# Patient Record
Sex: Male | Born: 1975 | Race: White | Hispanic: No | Marital: Single | Smoking: Current every day smoker
Health system: Southern US, Community
[De-identification: ages and names within clinical notes are randomized; demographics above are authoritative.]

## PROBLEM LIST (undated history)

## (undated) DIAGNOSIS — I219 Acute myocardial infarction, unspecified: Secondary | ICD-10-CM

## (undated) DIAGNOSIS — I639 Cerebral infarction, unspecified: Secondary | ICD-10-CM

## (undated) DIAGNOSIS — R569 Unspecified convulsions: Secondary | ICD-10-CM

---

## 1999-04-01 ENCOUNTER — Emergency Department (HOSPITAL_COMMUNITY): Admission: EM | Admit: 1999-04-01 | Discharge: 1999-04-01 | Payer: Self-pay | Admitting: Emergency Medicine

## 1999-04-01 ENCOUNTER — Encounter: Payer: Self-pay | Admitting: Emergency Medicine

## 2001-03-28 ENCOUNTER — Emergency Department (HOSPITAL_COMMUNITY): Admission: AC | Admit: 2001-03-28 | Discharge: 2001-03-28 | Payer: Self-pay

## 2002-02-12 ENCOUNTER — Encounter: Payer: Self-pay | Admitting: Emergency Medicine

## 2002-02-12 ENCOUNTER — Emergency Department (HOSPITAL_COMMUNITY): Admission: EM | Admit: 2002-02-12 | Discharge: 2002-02-12 | Payer: Self-pay | Admitting: Emergency Medicine

## 2002-12-06 ENCOUNTER — Emergency Department (HOSPITAL_COMMUNITY): Admission: EM | Admit: 2002-12-06 | Discharge: 2002-12-06 | Payer: Self-pay | Admitting: Emergency Medicine

## 2006-07-30 ENCOUNTER — Emergency Department (HOSPITAL_COMMUNITY): Admission: EM | Admit: 2006-07-30 | Discharge: 2006-07-30 | Payer: Self-pay | Admitting: Emergency Medicine

## 2008-02-09 ENCOUNTER — Emergency Department (HOSPITAL_COMMUNITY): Admission: EM | Admit: 2008-02-09 | Discharge: 2008-02-09 | Payer: Self-pay | Admitting: Emergency Medicine

## 2008-04-20 ENCOUNTER — Emergency Department (HOSPITAL_COMMUNITY): Admission: EM | Admit: 2008-04-20 | Discharge: 2008-04-20 | Payer: Self-pay | Admitting: Emergency Medicine

## 2008-08-19 ENCOUNTER — Inpatient Hospital Stay: Payer: Self-pay | Admitting: Specialist

## 2008-09-03 ENCOUNTER — Emergency Department: Payer: Self-pay | Admitting: Emergency Medicine

## 2008-09-18 ENCOUNTER — Emergency Department: Payer: Self-pay | Admitting: Emergency Medicine

## 2008-09-24 ENCOUNTER — Emergency Department (HOSPITAL_COMMUNITY): Admission: EM | Admit: 2008-09-24 | Discharge: 2008-09-25 | Payer: Self-pay | Admitting: Emergency Medicine

## 2008-10-22 ENCOUNTER — Inpatient Hospital Stay: Payer: Self-pay | Admitting: Psychiatry

## 2010-01-02 ENCOUNTER — Emergency Department (HOSPITAL_COMMUNITY)
Admission: EM | Admit: 2010-01-02 | Discharge: 2010-01-02 | Payer: Self-pay | Source: Home / Self Care | Admitting: Emergency Medicine

## 2010-01-13 ENCOUNTER — Emergency Department (HOSPITAL_COMMUNITY): Admission: EM | Admit: 2010-01-13 | Payer: Self-pay | Source: Home / Self Care | Admitting: Emergency Medicine

## 2010-02-11 ENCOUNTER — Emergency Department (HOSPITAL_COMMUNITY)
Admission: EM | Admit: 2010-02-11 | Discharge: 2010-02-11 | Payer: Self-pay | Source: Home / Self Care | Admitting: Emergency Medicine

## 2010-02-14 LAB — POCT I-STAT, CHEM 8
BUN: 19 mg/dL (ref 6–23)
Calcium, Ion: 1.13 mmol/L (ref 1.12–1.32)
Chloride: 104 mEq/L (ref 96–112)
Creatinine, Ser: 1.3 mg/dL (ref 0.4–1.5)
HCT: 47 % (ref 39.0–52.0)
Hemoglobin: 16 g/dL (ref 13.0–17.0)

## 2010-02-14 LAB — RAPID URINE DRUG SCREEN, HOSP PERFORMED
Amphetamines: NOT DETECTED
Benzodiazepines: NOT DETECTED
Opiates: NOT DETECTED

## 2010-04-04 LAB — CBC
HCT: 43.5 % (ref 39.0–52.0)
MCH: 29.5 pg (ref 26.0–34.0)
MCV: 89.7 fL (ref 78.0–100.0)
Platelets: 234 10*3/uL (ref 150–400)

## 2010-04-04 LAB — POCT I-STAT, CHEM 8
Creatinine, Ser: 1.1 mg/dL (ref 0.4–1.5)
Glucose, Bld: 91 mg/dL (ref 70–99)
Hemoglobin: 15.6 g/dL (ref 13.0–17.0)
Potassium: 4 mEq/L (ref 3.5–5.1)
Sodium: 141 mEq/L (ref 135–145)

## 2010-04-04 LAB — URINALYSIS, ROUTINE W REFLEX MICROSCOPIC
Bilirubin Urine: NEGATIVE
Glucose, UA: NEGATIVE mg/dL
Hgb urine dipstick: NEGATIVE
Ketones, ur: NEGATIVE mg/dL
Urobilinogen, UA: 0.2 mg/dL (ref 0.0–1.0)
pH: 7.5 (ref 5.0–8.0)

## 2010-04-04 LAB — DIFFERENTIAL
Basophils Relative: 0 % (ref 0–1)
Monocytes Absolute: 0.9 10*3/uL (ref 0.1–1.0)
Neutrophils Relative %: 73 % (ref 43–77)

## 2010-04-04 LAB — GC/CHLAMYDIA PROBE AMP, URINE: Chlamydia, Swab/Urine, PCR: NEGATIVE

## 2010-04-28 LAB — GASTRIC OCCULT BLOOD (1-CARD TO LAB): Occult Blood, Gastric: POSITIVE — AB

## 2010-04-28 LAB — CBC
HCT: 44.6 % (ref 39.0–52.0)
Hemoglobin: 15 g/dL (ref 13.0–17.0)
MCV: 91 fL (ref 78.0–100.0)
Platelets: 261 10*3/uL (ref 150–400)

## 2010-04-28 LAB — DIFFERENTIAL
Basophils Absolute: 0 10*3/uL (ref 0.0–0.1)
Lymphs Abs: 1 10*3/uL (ref 0.7–4.0)
Monocytes Absolute: 0.7 10*3/uL (ref 0.1–1.0)
Neutrophils Relative %: 90 % — ABNORMAL HIGH (ref 43–77)

## 2010-04-28 LAB — RAPID URINE DRUG SCREEN, HOSP PERFORMED
Barbiturates: NOT DETECTED
Benzodiazepines: NOT DETECTED
Cocaine: POSITIVE — AB

## 2010-04-28 LAB — URINE MICROSCOPIC-ADD ON

## 2010-04-28 LAB — URINALYSIS, ROUTINE W REFLEX MICROSCOPIC
Glucose, UA: NEGATIVE mg/dL
Specific Gravity, Urine: 1.024 (ref 1.005–1.030)

## 2010-04-28 LAB — BASIC METABOLIC PANEL
Calcium: 9.3 mg/dL (ref 8.4–10.5)
Chloride: 106 mEq/L (ref 96–112)
Creatinine, Ser: 1.38 mg/dL (ref 0.4–1.5)
GFR calc Af Amer: >60 mL/min (ref >60)
Potassium: 3.6 mEq/L (ref 3.5–5.1)

## 2010-04-28 LAB — TYPE AND SCREEN: Antibody Screen: NEGATIVE

## 2010-05-08 LAB — POCT CARDIAC MARKERS
CKMB, poc: <1 ng/mL — ABNORMAL LOW (ref 1.0–8.0)
Troponin i, poc: <0.05 ng/mL (ref 0.00–0.09)
Troponin i, poc: <0.05 ng/mL (ref 0.00–0.09)

## 2010-05-08 LAB — CBC
MCHC: 33 g/dL (ref 30.0–36.0)
MCV: 89.4 fL (ref 78.0–100.0)
Platelets: 258 10*3/uL (ref 150–400)
RBC: 5.2 MIL/uL (ref 4.22–5.81)
RDW: 14.2 % (ref 11.5–15.5)

## 2010-05-08 LAB — DIFFERENTIAL
Basophils Absolute: 0.1 10*3/uL (ref 0.0–0.1)
Basophils Relative: 1 % (ref 0–1)
Eosinophils Absolute: 0.1 10*3/uL (ref 0.0–0.7)
Monocytes Relative: 6 % (ref 3–12)
Neutrophils Relative %: 74 % (ref 43–77)

## 2010-05-08 LAB — POCT I-STAT, CHEM 8
Glucose, Bld: 93 mg/dL (ref 70–99)
HCT: 48 % (ref 39.0–52.0)
Hemoglobin: 16.3 g/dL (ref 13.0–17.0)
Potassium: 3.9 mEq/L (ref 3.5–5.1)

## 2010-05-08 LAB — GLUCOSE, CAPILLARY

## 2010-07-28 IMAGING — CR DG CHEST 1V PORT
1 series · 1 of 1 positions shown · non-contrast
Comparison: Report dated 12/06/2002.

CLINICAL DATA: Smoker with chest pain, shortness of breath and left
arm weakness for the past week.

PORTABLE CHEST - 1 VIEW

[AP]
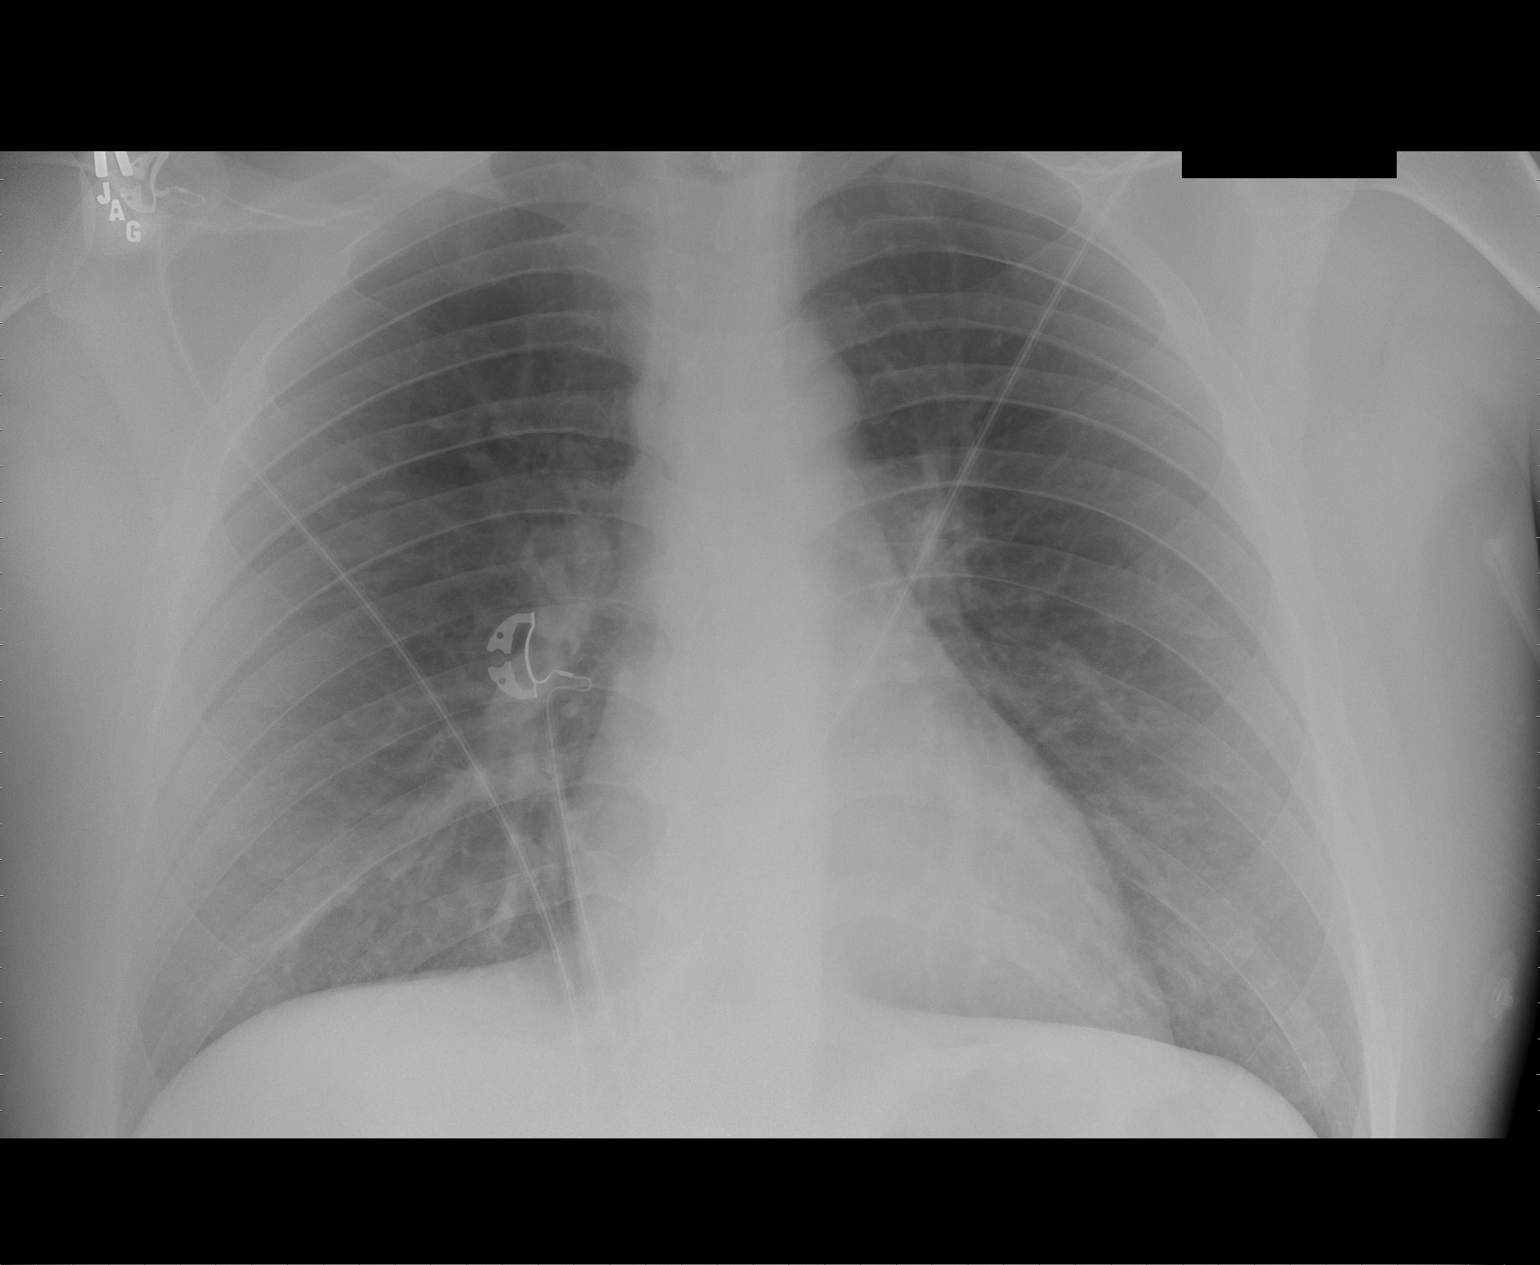

[1 of 1 positions shown; findings below may reference images not displayed]

FINDINGS: Normal sized heart.  Clear lungs with normal vascularity.
Unremarkable bones.
IMPRESSION: No acute abnormality.

## 2011-03-13 IMAGING — CR DG CHEST 2V
2 series · 2 of 2 positions shown · non-contrast
Comparison: 04/20/2008.

CLINICAL DATA: Seizure.  Vomiting blood.  Left side chest pain.

CHEST - 2 VIEW

[w chest lat]
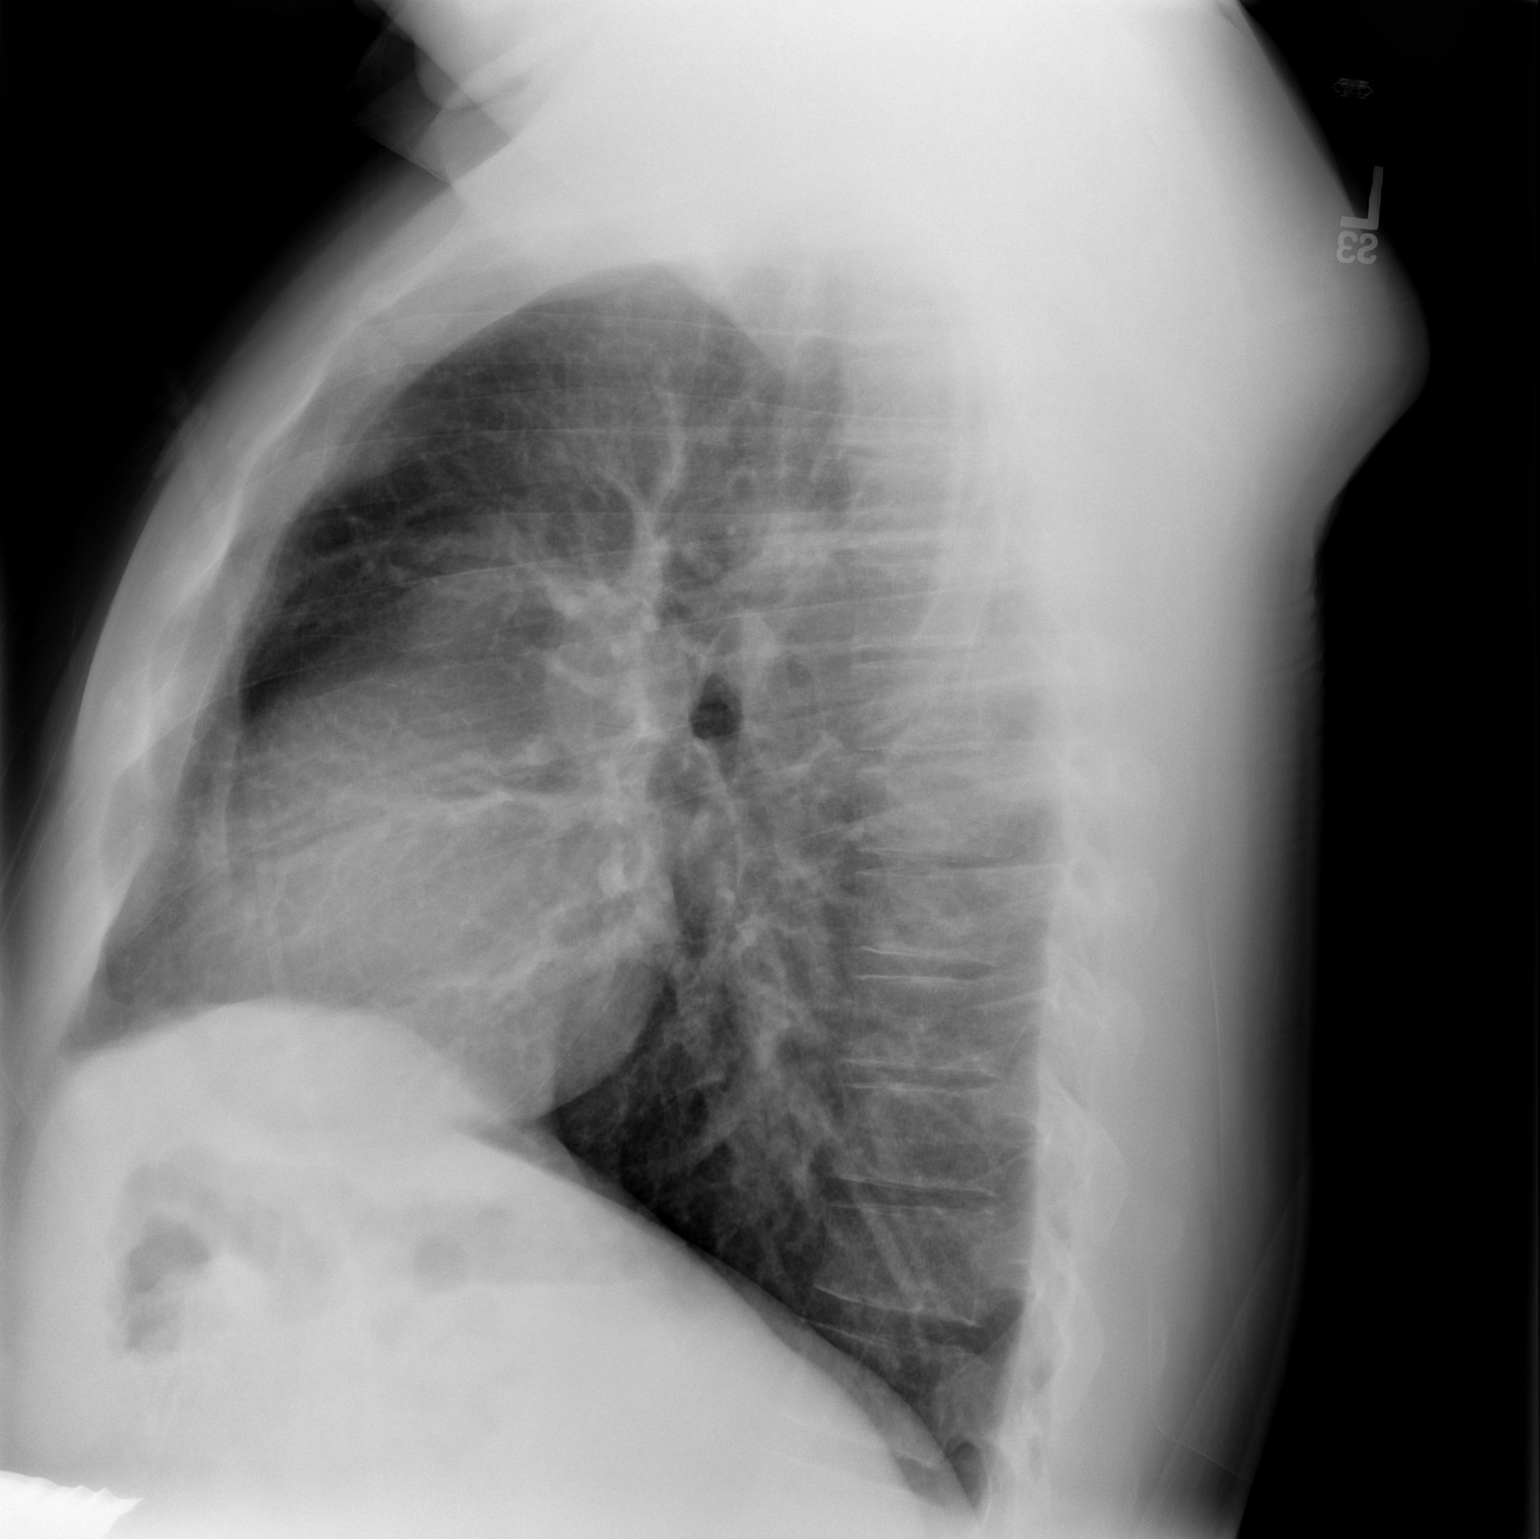

[w chest ap]
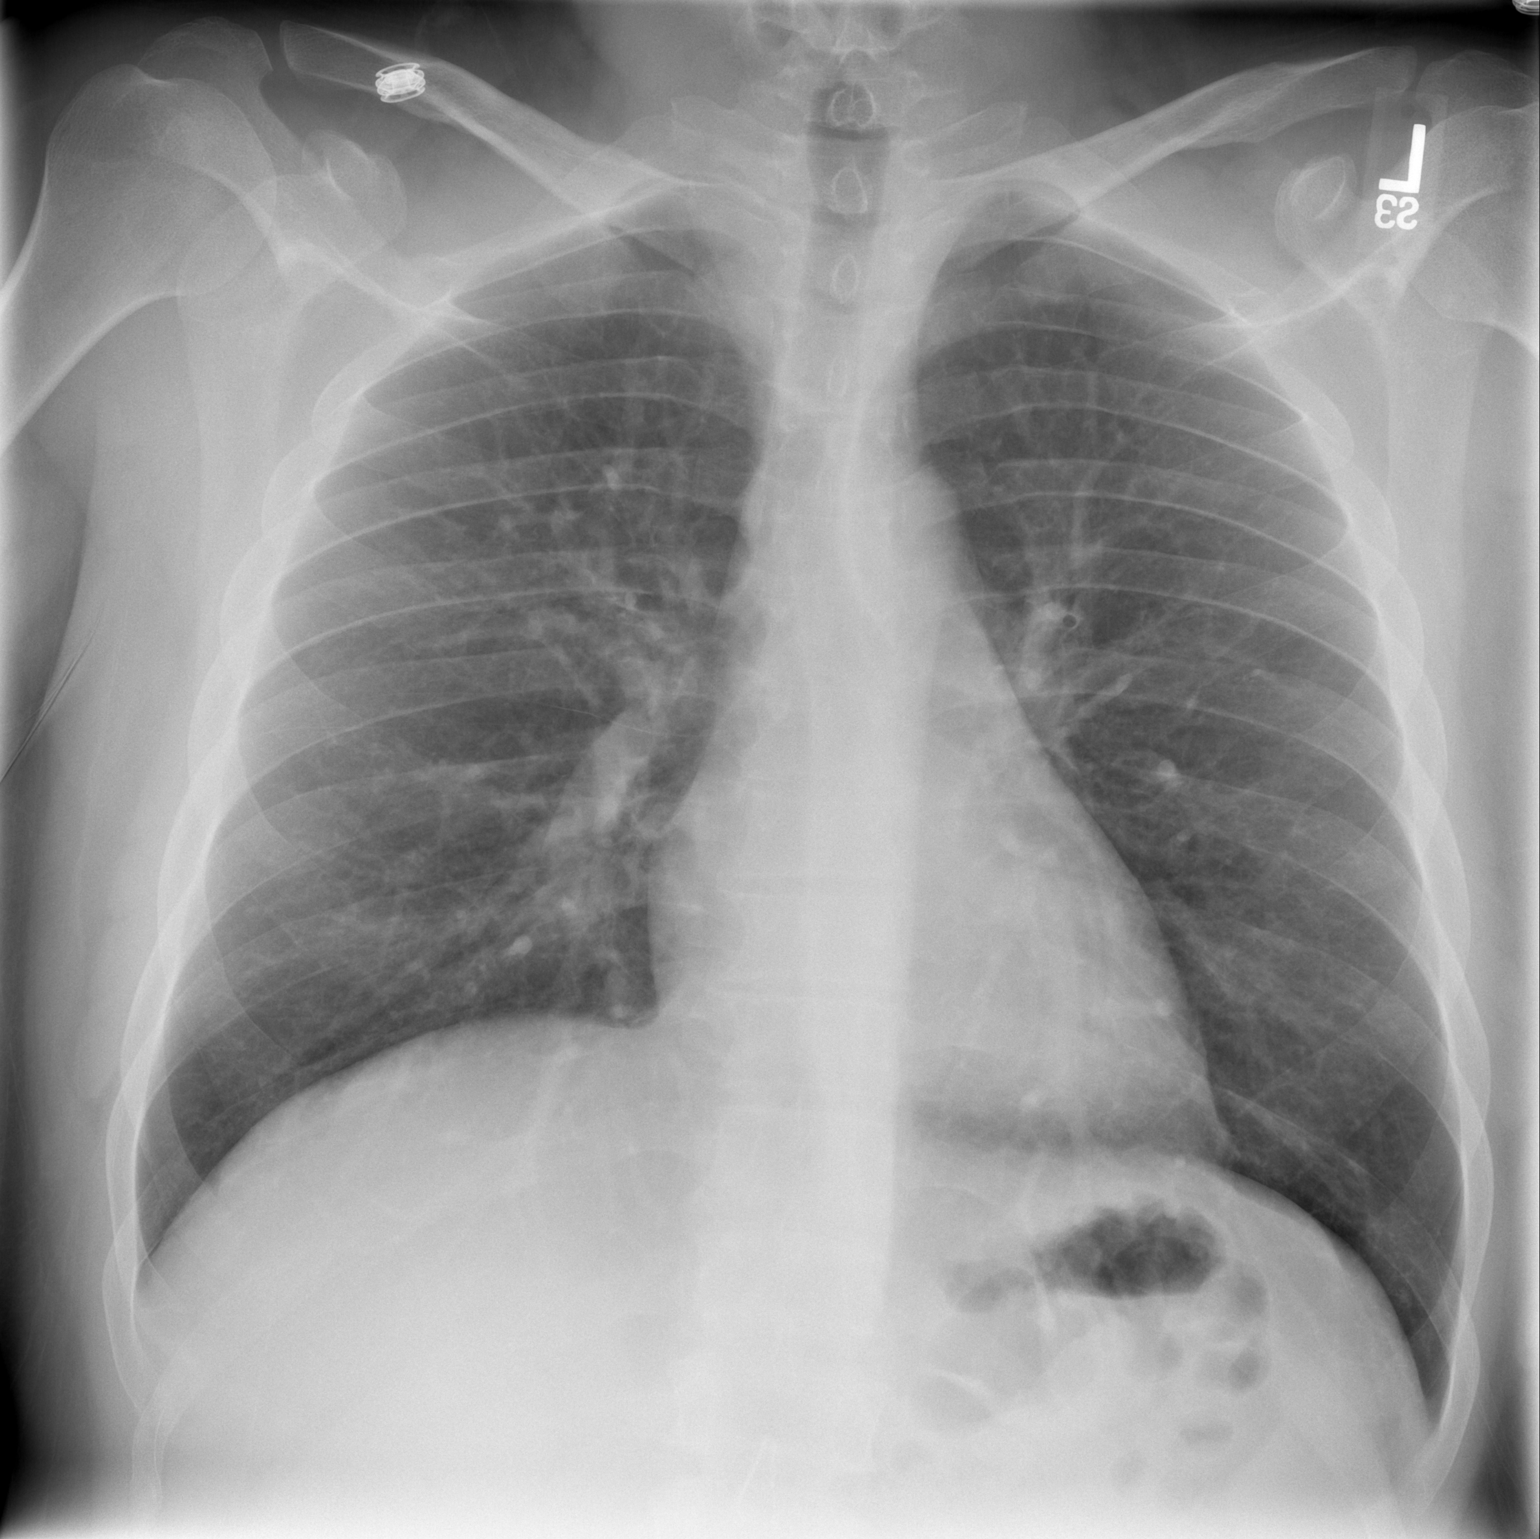

[2 of 2 positions shown; findings below may reference images not displayed]

FINDINGS: The lungs are clear without focal infiltrate, edema,
pneumothorax or pleural effusion. The cardiopericardial silhouette
is within normal limits for size. Imaged bony structures of the
thorax are intact.
IMPRESSION: Stable.  No acute cardiopulmonary findings.

## 2011-04-19 ENCOUNTER — Emergency Department: Payer: Self-pay | Admitting: Emergency Medicine

## 2011-04-19 LAB — COMPREHENSIVE METABOLIC PANEL
Alkaline Phosphatase: 74 U/L (ref 50–136)
Calcium, Total: 9.1 mg/dL (ref 8.5–10.1)
Creatinine: 0.93 mg/dL (ref 0.60–1.30)
EGFR (African American): >60
EGFR (Non-African Amer.): >60
Glucose: 74 mg/dL (ref 65–99)
Osmolality: 268 (ref 275–301)
SGPT (ALT): 28 U/L
Sodium: 134 mmol/L — ABNORMAL LOW (ref 136–145)
Total Protein: 8.1 g/dL (ref 6.4–8.2)

## 2011-04-19 LAB — CBC
HCT: 48.1 % (ref 40.0–52.0)
HGB: 16.2 g/dL (ref 13.0–18.0)
MCH: 30.1 pg (ref 26.0–34.0)
MCHC: 33.6 g/dL (ref 32.0–36.0)
MCV: 90 fL (ref 80–100)
Platelet: 256 10*3/uL (ref 150–440)
RBC: 5.37 10*6/uL (ref 4.40–5.90)
RDW: 14.2 % (ref 11.5–14.5)
WBC: 12.7 10*3/uL — ABNORMAL HIGH (ref 3.8–10.6)

## 2011-04-19 LAB — CK TOTAL AND CKMB (NOT AT ARMC)
CK, Total: 385 U/L — ABNORMAL HIGH (ref 35–232)
CK-MB: 3.9 ng/mL — ABNORMAL HIGH (ref 0.5–3.6)

## 2011-04-19 LAB — APTT: Activated PTT: 46.6 secs — ABNORMAL HIGH (ref 23.6–35.9)

## 2011-04-19 LAB — URINALYSIS, COMPLETE
Bilirubin,UR: NEGATIVE
Blood: NEGATIVE
Glucose,UR: NEGATIVE mg/dL (ref 0–75)
Hyaline Cast: 1
Leukocyte Esterase: NEGATIVE
Nitrite: NEGATIVE
Ph: 5 (ref 4.5–8.0)
Protein: NEGATIVE
RBC,UR: 1 /HPF (ref 0–5)
Specific Gravity: 1.013 (ref 1.003–1.030)
Squamous Epithelial: NONE SEEN
WBC UR: 3 /HPF (ref 0–5)

## 2011-04-19 LAB — TROPONIN I: Troponin-I: <0.02 ng/mL

## 2011-04-19 LAB — PROTIME-INR: Prothrombin Time: 13.5 secs (ref 11.5–14.7)

## 2013-03-04 ENCOUNTER — Emergency Department: Payer: Self-pay | Admitting: Internal Medicine

## 2013-03-04 LAB — COMPREHENSIVE METABOLIC PANEL
ALK PHOS: 69 U/L
ALT: 20 U/L (ref 12–78)
AST: 23 U/L (ref 15–37)
Albumin: 4.2 g/dL (ref 3.4–5.0)
Anion Gap: 3 — ABNORMAL LOW (ref 7–16)
BUN: 7 mg/dL (ref 7–18)
Bilirubin,Total: 0.4 mg/dL (ref 0.2–1.0)
CALCIUM: 9.5 mg/dL (ref 8.5–10.1)
CHLORIDE: 105 mmol/L (ref 98–107)
Co2: 27 mmol/L (ref 21–32)
Creatinine: 0.98 mg/dL (ref 0.60–1.30)
GLUCOSE: 85 mg/dL (ref 65–99)
Osmolality: 267 (ref 275–301)
POTASSIUM: 4 mmol/L (ref 3.5–5.1)
SODIUM: 135 mmol/L — AB (ref 136–145)
Total Protein: 7.9 g/dL (ref 6.4–8.2)

## 2013-03-04 LAB — URINALYSIS, COMPLETE
BILIRUBIN, UR: NEGATIVE
Bacteria: NONE SEEN
Blood: NEGATIVE
Glucose,UR: NEGATIVE mg/dL (ref 0–75)
Ketone: NEGATIVE
Leukocyte Esterase: NEGATIVE
Nitrite: NEGATIVE
PH: 7 (ref 4.5–8.0)
PROTEIN: NEGATIVE
RBC,UR: <1 /HPF (ref 0–5)
SPECIFIC GRAVITY: 1.002 (ref 1.003–1.030)
Squamous Epithelial: NONE SEEN

## 2013-03-04 LAB — CBC WITH DIFFERENTIAL/PLATELET
BASOS ABS: 0.1 10*3/uL (ref 0.0–0.1)
Basophil %: 0.7 %
EOS ABS: 0.2 10*3/uL (ref 0.0–0.7)
EOS PCT: 1.3 %
HCT: 45 % (ref 40.0–52.0)
HGB: 15.2 g/dL (ref 13.0–18.0)
LYMPHS ABS: 2.1 10*3/uL (ref 1.0–3.6)
Lymphocyte %: 14.5 %
MCH: 30.4 pg (ref 26.0–34.0)
MCHC: 33.7 g/dL (ref 32.0–36.0)
MCV: 90 fL (ref 80–100)
MONO ABS: 0.8 x10 3/mm (ref 0.2–1.0)
Monocyte %: 5.6 %
NEUTROS ABS: 11.5 10*3/uL — AB (ref 1.4–6.5)
NEUTROS PCT: 77.9 %
PLATELETS: 268 10*3/uL (ref 150–440)
RBC: 4.98 10*6/uL (ref 4.40–5.90)
RDW: 14.3 % (ref 11.5–14.5)
WBC: 14.7 10*3/uL — AB (ref 3.8–10.6)

## 2013-03-11 ENCOUNTER — Emergency Department: Payer: Self-pay | Admitting: Emergency Medicine

## 2013-03-11 LAB — COMPREHENSIVE METABOLIC PANEL
AST: 22 U/L (ref 15–37)
Albumin: 4.1 g/dL (ref 3.4–5.0)
Alkaline Phosphatase: 68 U/L
Anion Gap: 3 — ABNORMAL LOW (ref 7–16)
BILIRUBIN TOTAL: 0.3 mg/dL (ref 0.2–1.0)
BUN: 7 mg/dL (ref 7–18)
CALCIUM: 9.4 mg/dL (ref 8.5–10.1)
CHLORIDE: 107 mmol/L (ref 98–107)
CREATININE: 1.02 mg/dL (ref 0.60–1.30)
Co2: 27 mmol/L (ref 21–32)
EGFR (African American): >60
EGFR (Non-African Amer.): >60
GLUCOSE: 86 mg/dL (ref 65–99)
OSMOLALITY: 271 (ref 275–301)
Potassium: 3.8 mmol/L (ref 3.5–5.1)
SGPT (ALT): 18 U/L (ref 12–78)
Sodium: 137 mmol/L (ref 136–145)
Total Protein: 7.4 g/dL (ref 6.4–8.2)

## 2013-03-11 LAB — DRUG SCREEN, URINE
Amphetamines, Ur Screen: NEGATIVE (ref ?–1000)
BARBITURATES, UR SCREEN: NEGATIVE (ref ?–200)
Benzodiazepine, Ur Scrn: NEGATIVE (ref ?–200)
CANNABINOID 50 NG, UR ~~LOC~~: NEGATIVE (ref ?–50)
COCAINE METABOLITE, UR ~~LOC~~: NEGATIVE (ref ?–300)
MDMA (Ecstasy)Ur Screen: NEGATIVE (ref ?–500)
Methadone, Ur Screen: NEGATIVE (ref ?–300)
Opiate, Ur Screen: NEGATIVE (ref ?–300)
PHENCYCLIDINE (PCP) UR S: NEGATIVE (ref ?–25)
TRICYCLIC, UR SCREEN: NEGATIVE (ref ?–1000)

## 2013-03-11 LAB — URINALYSIS, COMPLETE
BLOOD: NEGATIVE
Bacteria: NONE SEEN
Bilirubin,UR: NEGATIVE
Glucose,UR: NEGATIVE mg/dL (ref 0–75)
KETONE: NEGATIVE
Leukocyte Esterase: NEGATIVE
NITRITE: NEGATIVE
Ph: 7 (ref 4.5–8.0)
Protein: NEGATIVE
RBC, UR: NONE SEEN /HPF (ref 0–5)
Specific Gravity: 1.003 (ref 1.003–1.030)
Squamous Epithelial: NONE SEEN
WBC UR: NONE SEEN /HPF (ref 0–5)

## 2013-03-11 LAB — CBC
HCT: 42.2 % (ref 40.0–52.0)
HGB: 14.6 g/dL (ref 13.0–18.0)
MCH: 30.9 pg (ref 26.0–34.0)
MCHC: 34.6 g/dL (ref 32.0–36.0)
MCV: 89 fL (ref 80–100)
PLATELETS: 249 10*3/uL (ref 150–440)
RBC: 4.72 10*6/uL (ref 4.40–5.90)
RDW: 14.1 % (ref 11.5–14.5)
WBC: 13.8 10*3/uL — AB (ref 3.8–10.6)

## 2013-03-11 LAB — TROPONIN I: Troponin-I: <0.02 ng/mL

## 2013-11-29 ENCOUNTER — Emergency Department: Payer: Self-pay | Admitting: Emergency Medicine

## 2013-11-29 LAB — CBC WITH DIFFERENTIAL/PLATELET
BASOS ABS: 0.1 10*3/uL (ref 0.0–0.1)
Basophil %: 0.9 %
EOS PCT: 1.8 %
Eosinophil #: 0.3 10*3/uL (ref 0.0–0.7)
HCT: 43.5 % (ref 40.0–52.0)
HGB: 14.8 g/dL (ref 13.0–18.0)
LYMPHS ABS: 2.8 10*3/uL (ref 1.0–3.6)
LYMPHS PCT: 19.9 %
MCH: 30.8 pg (ref 26.0–34.0)
MCHC: 33.9 g/dL (ref 32.0–36.0)
MCV: 91 fL (ref 80–100)
Monocyte #: 0.9 x10 3/mm (ref 0.2–1.0)
Monocyte %: 6.4 %
Neutrophil #: 10.1 10*3/uL — ABNORMAL HIGH (ref 1.4–6.5)
Neutrophil %: 71 %
PLATELETS: 277 10*3/uL (ref 150–440)
RBC: 4.79 10*6/uL (ref 4.40–5.90)
RDW: 14.3 % (ref 11.5–14.5)
WBC: 14.3 10*3/uL — AB (ref 3.8–10.6)

## 2013-11-29 LAB — COMPREHENSIVE METABOLIC PANEL
ALK PHOS: 69 U/L
AST: 14 U/L — AB (ref 15–37)
Albumin: 4.5 g/dL (ref 3.4–5.0)
Anion Gap: 5 — ABNORMAL LOW (ref 7–16)
BUN: 15 mg/dL (ref 7–18)
Bilirubin,Total: 0.3 mg/dL (ref 0.2–1.0)
CO2: 32 mmol/L (ref 21–32)
Calcium, Total: 9.4 mg/dL (ref 8.5–10.1)
Chloride: 102 mmol/L (ref 98–107)
Creatinine: 1.3 mg/dL (ref 0.60–1.30)
EGFR (Non-African Amer.): >60
GLUCOSE: 90 mg/dL (ref 65–99)
Osmolality: 278 (ref 275–301)
Potassium: 3.7 mmol/L (ref 3.5–5.1)
SGPT (ALT): 27 U/L
Sodium: 139 mmol/L (ref 136–145)
Total Protein: 8 g/dL (ref 6.4–8.2)

## 2013-11-29 LAB — URINALYSIS, COMPLETE
BILIRUBIN, UR: NEGATIVE
BLOOD: NEGATIVE
Bacteria: NONE SEEN
GLUCOSE, UR: NEGATIVE mg/dL (ref 0–75)
Ketone: NEGATIVE
Leukocyte Esterase: NEGATIVE
Nitrite: NEGATIVE
PROTEIN: NEGATIVE
Ph: 5 (ref 4.5–8.0)
RBC,UR: NONE SEEN /HPF (ref 0–5)
Specific Gravity: 1.025 (ref 1.003–1.030)
Squamous Epithelial: NONE SEEN

## 2014-04-19 ENCOUNTER — Emergency Department: Payer: Self-pay | Admitting: Emergency Medicine

## 2014-07-02 ENCOUNTER — Encounter: Payer: Self-pay | Admitting: Emergency Medicine

## 2014-07-02 ENCOUNTER — Emergency Department
Admission: EM | Admit: 2014-07-02 | Discharge: 2014-07-02 | Disposition: A | Discharge status: Home / Self Care | Payer: Self-pay | Attending: Emergency Medicine | Admitting: Emergency Medicine

## 2014-07-02 DIAGNOSIS — Y939 Activity, unspecified: Secondary | ICD-10-CM | POA: Insufficient documentation

## 2014-07-02 DIAGNOSIS — S29012A Strain of muscle and tendon of back wall of thorax, initial encounter: Secondary | ICD-10-CM | POA: Insufficient documentation

## 2014-07-02 DIAGNOSIS — X58XXXA Exposure to other specified factors, initial encounter: Secondary | ICD-10-CM | POA: Insufficient documentation

## 2014-07-02 DIAGNOSIS — Y929 Unspecified place or not applicable: Secondary | ICD-10-CM | POA: Insufficient documentation

## 2014-07-02 DIAGNOSIS — S39012A Strain of muscle, fascia and tendon of lower back, initial encounter: Secondary | ICD-10-CM

## 2014-07-02 DIAGNOSIS — Z720 Tobacco use: Secondary | ICD-10-CM | POA: Insufficient documentation

## 2014-07-02 DIAGNOSIS — Y999 Unspecified external cause status: Secondary | ICD-10-CM | POA: Insufficient documentation

## 2014-07-02 MED ORDER — IBUPROFEN 800 MG PO TABS: 800.0000 mg | ORAL_TABLET | Freq: Three times a day (TID) | ORAL | Status: DC | PRN

## 2014-07-02 MED ORDER — KETOROLAC TROMETHAMINE 30 MG/ML IJ SOLN: 60.0000 mg | Freq: Once | INTRAMUSCULAR | Status: AC

## 2014-07-02 MED ORDER — CYCLOBENZAPRINE HCL 10 MG PO TABS: 10.0000 mg | ORAL_TABLET | Freq: Three times a day (TID) | ORAL | Status: DC | PRN

## 2014-07-02 MED ORDER — HYDROCODONE-ACETAMINOPHEN 5-325 MG PO TABS: 1.0000 | ORAL_TABLET | ORAL | Status: DC | PRN

## 2014-07-02 MED ORDER — KETOROLAC TROMETHAMINE 60 MG/2ML IM SOLN
INTRAMUSCULAR | Status: AC
  Administered 2014-07-02: 60 mg
  Filled 2014-07-02: qty 2

## 2014-07-02 NOTE — ED Provider Notes (Signed)
[facility] Emergency Department Provider Note  ____________________________________________  Time seen: Approximately 12:49 PM  I have reviewed the triage vital signs and the nursing notes.   HISTORY  Chief Complaint Back Pain    HPI RONIEL HALLORAN is a 39 y.o. male who presents for evaluation of left lateral back strain. Patient states that he's been to a chiropractor with no relief denies any trauma. Denies any distress or difficulty breathing. He works outside a lot unsure if he lifted her poor muscle. Denies any urinary symptoms. Pain presently is 10 over 10 but will vary from 5-10.   History reviewed. No pertinent past medical history.  There are no active problems to display for this patient.   History reviewed. No pertinent past surgical history.  Current Outpatient Rx  Name  Route  Sig  Dispense  Refill  . cyclobenzaprine (FLEXERIL) 10 MG tablet   Oral   Take 1 tablet (10 mg total) by mouth every 8 (eight) hours as needed for muscle spasms.   30 tablet   1   . HYDROcodone-acetaminophen (NORCO) 5-325 MG per tablet   Oral   Take 1-2 tablets by mouth every 4 (four) hours as needed for moderate pain.   12 tablet   0   . ibuprofen (ADVIL,MOTRIN) 800 MG tablet   Oral   Take 1 tablet (800 mg total) by mouth every 8 (eight) hours as needed.   30 tablet   0     Allergies Review of patient's allergies indicates no known allergies.  History reviewed. No pertinent family history.  Social History History  Substance Use Topics  . Smoking status: Current Every Day Smoker  . Smokeless tobacco: Not on file  . Alcohol Use: Yes    Review of Systems Constitutional: No fever/chills Eyes: No visual changes. ENT: No sore throat. Cardiovascular: Denies chest pain. Respiratory: Denies shortness of breath. Gastrointestinal: No abdominal pain.  No nausea, no vomiting.  No diarrhea.  No constipation. Genitourinary: Negative for  dysuria. Musculoskeletal: Positive for left lateral thoracic strain. Skin: Negative for rash. Neurological: Negative for headaches, focal weakness or numbness.  10-point ROS otherwise negative.  ____________________________________________   PHYSICAL EXAM:  VITAL SIGNS: ED Triage Vitals  Enc Vitals Group     BP 07/02/14 1224 131/99 mmHg     Pulse Rate 07/02/14 1224 74     Resp --      Temp 07/02/14 1224 98.4 F (36.9 C)     Temp Source 07/02/14 1224 Oral     SpO2 07/02/14 1224 98 %     Weight 07/02/14 1224 200 lb (90.719 kg)     Height 07/02/14 1224 6' (1.829 m)     Head Cir --      Peak Flow --      Pain Score 07/02/14 1229 10     Pain Loc --      Pain Edu? --      Excl. in GC? --     Constitutional: Alert and oriented. Well appearing and in no acute distress. Eyes: Conjunctivae are normal. PERRL. EOMI. Head: Atraumatic. Nose: No congestion/rhinnorhea. Mouth/Throat: Mucous membranes are moist.  Oropharynx non-erythematous. Neck: No stridor.   Cardiovascular: Normal rate, regular rhythm. Grossly normal heart sounds.  Good peripheral circulation. Respiratory: Normal respiratory effort.  No retractions. Lungs CTAB. Gastrointestinal: Soft and nontender. No distention. No abdominal bruits. No CVA tenderness. Musculoskeletal: No lower extremity tenderness nor edema.  No joint effusions. No spinal tenderness. Shoulders full range  of motion bilaterally straight leg raise negative. Pain to the lateral dorsalis muscles only. left greater than right Neurologic:  Normal speech and language. No gross focal neurologic deficits are appreciated. Speech is normal. No gait instability. Skin:  Skin is warm, dry and intact. No rash noted. Psychiatric: Mood and affect are normal. Speech and behavior are normal.  ____________________________________________   LABS (all labs ordered are listed, but only abnormal results are displayed)  Labs Reviewed - No data to  display ____________________________________________  EKG  Deferred ____________________________________________  RADIOLOGY  Deferred ____________________________________________   PROCEDURES  Procedure(s) performed: None  Critical Care performed: No  ____________________________________________   INITIAL IMPRESSION / ASSESSMENT AND PLAN / ED COURSE  Pertinent labs & imaging results that were available during my care of the patient were reviewed by me and considered in my medical decision making (see chart for details).  Myofascial strain. Rx given for Motrin 800 and Flexeril 10 mg daily. Toradol 60 mg given IM while here in the clinic. Patient voices no other emergency medical conditions at this time. He will return to the ER if symptoms worsen. ____________________________________________   FINAL CLINICAL IMPRESSION(S) / ED DIAGNOSES  Final diagnoses:  Strain, back, initial encounter      Evangeline Dakin, PA-C 07/02/14 1337  Sharyn Creamer, MD 07/02/14 1620

## 2014-07-02 NOTE — ED Notes (Signed)
Pt to ed with c/o chronic back pain.  Pt states started about 3 years ago.  Pt denies recent injury but states pain is unbearable.

## 2014-07-02 NOTE — ED Notes (Signed)
Pt has chronic back pain that has gotten worse over time. Pt came to the ED today because of extreme pain in his lower and upper back. He states lower back pain is worse than the upper back pain. Denies  Any recent trauma . Color and breathing WNL , appears to be in no distress at this time.

## 2014-07-02 NOTE — Discharge Instructions (Signed)
Back Pain, Adult Low back pain is very common. About 1 in 5 people have back pain.The cause of low back pain is rarely dangerous. The pain often gets better over time.About half of people with a sudden onset of back pain feel better in just 2 weeks. About 8 in 10 people feel better by 6 weeks.  CAUSES Some common causes of back pain include:  Strain of the muscles or ligaments supporting the spine.  Wear and tear (degeneration) of the spinal discs.  Arthritis.  Direct injury to the back. DIAGNOSIS Most of the time, the direct cause of low back pain is not known.However, back pain can be treated effectively even when the exact cause of the pain is unknown.Answering your caregiver's questions about your overall health and symptoms is one of the most accurate ways to make sure the cause of your pain is not dangerous. If your caregiver needs more information, he or she may order lab work or imaging tests (X-rays or MRIs).However, even if imaging tests show changes in your back, this usually does not require surgery. HOME CARE INSTRUCTIONS For many people, back pain returns.Since low back pain is rarely dangerous, it is often a condition that people can learn to manageon their own.   Remain active. It is stressful on the back to sit or stand in one place. Do not sit, drive, or stand in one place for more than 30 minutes at a time. Take short walks on level surfaces as soon as pain allows.Try to increase the length of time you walk each day.  Do not stay in bed.Resting more than 1 or 2 days can delay your recovery.  Do not avoid exercise or work.Your body is made to move.It is not dangerous to be active, even though your back may hurt.Your back will likely heal faster if you return to being active before your pain is gone.  Pay attention to your body when you bend and lift. Many people have less discomfortwhen lifting if they bend their knees, keep the load close to their bodies,and  avoid twisting. Often, the most comfortable positions are those that put less stress on your recovering back.  Find a comfortable position to sleep. Use a firm mattress and lie on your side with your knees slightly bent. If you lie on your back, put a pillow under your knees.  Only take over-the-counter or prescription medicines as directed by your caregiver. Over-the-counter medicines to reduce pain and inflammation are often the most helpful.Your caregiver may prescribe muscle relaxant drugs.These medicines help dull your pain so you can more quickly return to your normal activities and healthy exercise.  Put ice on the injured area.  Put ice in a plastic bag.  Place a towel between your skin and the bag.  Leave the ice on for 15-20 minutes, 03-04 times a day for the first 2 to 3 days. After that, ice and heat may be alternated to reduce pain and spasms.  Ask your caregiver about trying back exercises and gentle massage. This may be of some benefit.  Avoid feeling anxious or stressed.Stress increases muscle tension and can worsen back pain.It is important to recognize when you are anxious or stressed and learn ways to manage it.Exercise is a great option. SEEK MEDICAL CARE IF:  You have pain that is not relieved with rest or medicine.  You have pain that does not improve in 1 week.  You have new symptoms.  You are generally not feeling well. SEEK   IMMEDIATE MEDICAL CARE IF:   You have pain that radiates from your back into your legs.  You develop new bowel or bladder control problems.  You have unusual weakness or numbness in your arms or legs.  You develop nausea or vomiting.  You develop abdominal pain.  You feel faint. Document Released: 01/08/2005 Document Revised: 07/10/2011 Document Reviewed: 05/12/2013 [employer] Patient Information 2015 [employer]. This information is not intended to replace advice given to you by your health care provider. Make sure you  discuss any questions you have with your health care provider.  

## 2014-07-05 ENCOUNTER — Emergency Department
Admission: EM | Admit: 2014-07-05 | Discharge: 2014-07-05 | Disposition: A | Discharge status: Home / Self Care | Payer: Self-pay | Attending: Emergency Medicine | Admitting: Emergency Medicine

## 2014-07-05 ENCOUNTER — Encounter: Payer: Self-pay | Admitting: Emergency Medicine

## 2014-07-05 DIAGNOSIS — Z720 Tobacco use: Secondary | ICD-10-CM | POA: Insufficient documentation

## 2014-07-05 DIAGNOSIS — G44219 Episodic tension-type headache, not intractable: Secondary | ICD-10-CM | POA: Insufficient documentation

## 2014-07-05 HISTORY — DX: Unspecified convulsions: R56.9

## 2014-07-05 MED ORDER — ONDANSETRON 4 MG PO TBDP
4.0000 mg | ORAL_TABLET | Freq: Once | ORAL | Status: AC
  Administered 2014-07-05: 4 mg via ORAL

## 2014-07-05 MED ORDER — BUTALBITAL-APAP-CAFFEINE 50-325-40 MG PO TABS
2.0000 | ORAL_TABLET | Freq: Once | ORAL | Status: AC
  Administered 2014-07-05: 2 via ORAL

## 2014-07-05 MED ORDER — BUTALBITAL-APAP-CAFFEINE 50-325-40 MG PO TABS
ORAL_TABLET | ORAL | Status: AC
  Administered 2014-07-05: 2 via ORAL
  Filled 2014-07-05: qty 2

## 2014-07-05 MED ORDER — ONDANSETRON 4 MG PO TBDP
ORAL_TABLET | ORAL | Status: AC
  Administered 2014-07-05: 4 mg via ORAL
  Filled 2014-07-05: qty 1

## 2014-07-05 NOTE — ED Provider Notes (Signed)
[facility] Emergency Department Provider Note  ____________________________________________  Time seen: Approximately 4:22 PM  I have reviewed the triage vital signs and the nursing notes.   HISTORY  Chief Complaint Nausea and Headache    HPI Jimmy Thompson is a 39 y.o. male presents to the emergency department for headache on the left forehead that radiates around to the left side of his head. He also complains of mild nausea without vomiting. States the headache has been there for 2 days. He does not recall the time of onset or what he was doing at that time. He was recently seen here for shoulder pain, but did not fill his medications. His pain is a 10 on 10. Light and sound have no effect on the pain.   Past Medical History  Diagnosis Date  . Seizures     There are no active problems to display for this patient.   No past surgical history on file.  Current Outpatient Rx  Name  Route  Sig  Dispense  Refill  . cyclobenzaprine (FLEXERIL) 10 MG tablet   Oral   Take 1 tablet (10 mg total) by mouth every 8 (eight) hours as needed for muscle spasms.   30 tablet   1   . HYDROcodone-acetaminophen (NORCO) 5-325 MG per tablet   Oral   Take 1-2 tablets by mouth every 4 (four) hours as needed for moderate pain.   12 tablet   0   . ibuprofen (ADVIL,MOTRIN) 800 MG tablet   Oral   Take 1 tablet (800 mg total) by mouth every 8 (eight) hours as needed.   30 tablet   0     Allergies Review of patient's allergies indicates no known allergies.  No family history on file.  Social History History  Substance Use Topics  . Smoking status: Current Every Day Smoker  . Smokeless tobacco: Not on file  . Alcohol Use: Yes    Review of Systems Constitutional: No fever/chills Eyes: No visual changes. ENT: No sore throat. Cardiovascular: Denies chest pain. Respiratory: Denies shortness of breath. Gastrointestinal: No abdominal pain.  No nausea, no  vomiting.  No diarrhea.  No constipation. Genitourinary: Negative for dysuria. Musculoskeletal: Negative for back pain. Left shoulder pain. Skin: Negative for rash. Neurological: Positive for headache negative for focal weakness or numbness.  10-point ROS otherwise negative.  ____________________________________________   PHYSICAL EXAM:  VITAL SIGNS: ED Triage Vitals  Enc Vitals Group     BP --      Pulse Rate 07/05/14 1543 55     Resp --      Temp 07/05/14 1543 98.5 F (36.9 C)     Temp Source 07/05/14 1543 Oral     SpO2 07/05/14 1543 99 %     Weight 07/05/14 1543 200 lb (90.719 kg)     Height 07/05/14 1543 6' (1.829 m)     Head Cir --      Peak Flow --      Pain Score 07/05/14 1543 5     Pain Loc --      Pain Edu? --      Excl. in GC? --     Constitutional: Alert and oriented. Well appearing and in no acute distress. Eyes: Conjunctivae are normal. PERRL. EOMI. Head: Atraumatic. Nose: No congestion/rhinnorhea. Mouth/Throat: Mucous membranes are moist.  Oropharynx non-erythematous. Neck: No stridor.   Cardiovascular: Normal rate, regular rhythm. Grossly normal heart sounds.  Good peripheral circulation. Respiratory: Normal respiratory effort.  No retractions. Lungs CTAB. Gastrointestinal: Soft and nontender. No distention. No abdominal bruits. No CVA tenderness. Musculoskeletal: No lower extremity tenderness nor edema.  No joint effusions. Full range of motion of left shoulder, tenderness diffusely over left shoulder without focal bony tenderness or step off. Neurologic:  Normal speech and language. No gross focal neurologic deficits are appreciated. Speech is normal. No gait instability. Negative Romberg, negative finger-nose test, pupils 2 and equal. Skin:  Skin is warm, dry and intact. No rash noted. Psychiatric: Mood and affect are normal. Speech and behavior are normal.  ____________________________________________   LABS (all labs ordered are listed, but only  abnormal results are displayed)  Labs Reviewed - No data to display ____________________________________________  EKG   ____________________________________________  RADIOLOGY  Not indicated ____________________________________________   PROCEDURES  Procedure(s) performed: None  Critical Care performed: No  ____________________________________________   INITIAL IMPRESSION / ASSESSMENT AND PLAN / ED COURSE  Pertinent labs & imaging results that were available during my care of the patient were reviewed by me and considered in my medical decision making (see chart for details).  Patient was advised to fill the prescriptions that he was given at his last visit. They will also help with headache. He was advised to establish primary care provider for follow-up. He was advised to return to the emergency department for symptoms that change or worsen if he is unable schedule an appointment. ____________________________________________   FINAL CLINICAL IMPRESSION(S) / ED DIAGNOSES  Final diagnoses:  None      Chinita Pester, FNP 07/05/14 1859  Maurilio Lovely, MD 07/05/14 2351

## 2014-07-05 NOTE — ED Notes (Signed)
Pt to ED with c/o headache and nausea since last night, states he was seen in the ED Friday for back pain and left shoulder pain, states he was given 3 prescriptions for pain. States he continues to have pain as well as new symptoms

## 2014-07-05 NOTE — Discharge Instructions (Signed)
Tension Headache You need to fill the prescriptions that you were given at your last visit. They will also help your headache. Return to the ER for symptoms that change or worsen if you are unable to schedule an appointment with primary care.  A tension headache is pain, pressure, or aching felt over the front and sides of the head. Tension headaches often come after stress, feeling worried (anxiety), or feeling sad or down for a while (depressed). HOME CARE  Only take medicine as told by your doctor.  Lie down in a dark, quiet room when you have a headache.  Keep a journal to find out if certain things bring on headaches. For example, write down:  What you eat and drink.  How much sleep you get.  Any change to your diet or medicines.  Relax by getting a massage or doing other relaxing activities.  Put ice or heat packs on the head and neck area as told by your doctor.  Lessen stress.  Sit up straight. Do not tighten (tense) your muscles.  Quit smoking if you smoke.  Lessen how much alcohol you drink.  Lessen how much caffeine you drink, or stop drinking caffeine.  Eat and exercise regularly.  Get enough sleep.  Avoid using too much pain medicine. GET HELP RIGHT AWAY IF:   Your headache becomes really bad.  You have a fever.  You have a stiff neck.  You have trouble seeing.  Your muscles are weak, or you lose muscle control.  You lose your balance or have trouble walking.  You feel like you will pass out (faint), or you pass out.  You have really bad symptoms that are different than your first symptoms.  You have problems with the medicines given to you by your doctor.  Your medicines do not work.  Your headache feels different than the other headaches.  You feel sick to your stomach (nauseous) or throw up (vomit). MAKE SURE YOU:   Understand these instructions.  Will watch your condition.  Will get help right away if you are not doing well or get  worse. Document Released: 04/04/2009 Document Revised: 04/02/2011 Document Reviewed: 12/29/2010 [facility] Patient Information 2015 [place], [place]. This information is not intended to replace advice given to you by your health care provider. Make sure you discuss any questions you have with your health care provider.

## 2015-01-19 ENCOUNTER — Emergency Department
Admission: EM | Admit: 2015-01-19 | Discharge: 2015-01-19 | Disposition: A | Discharge status: Home / Self Care | Payer: Self-pay | Attending: Emergency Medicine | Admitting: Emergency Medicine

## 2015-01-19 ENCOUNTER — Emergency Department: Payer: Self-pay

## 2015-01-19 DIAGNOSIS — F17200 Nicotine dependence, unspecified, uncomplicated: Secondary | ICD-10-CM | POA: Insufficient documentation

## 2015-01-19 DIAGNOSIS — R1032 Left lower quadrant pain: Secondary | ICD-10-CM | POA: Insufficient documentation

## 2015-01-19 DIAGNOSIS — G8929 Other chronic pain: Secondary | ICD-10-CM | POA: Insufficient documentation

## 2015-01-19 HISTORY — DX: Acute myocardial infarction, unspecified: I21.9

## 2015-01-19 LAB — CBC
HEMATOCRIT: 47.3 % (ref 40.0–52.0)
HEMOGLOBIN: 15.7 g/dL (ref 13.0–18.0)
MCH: 29.6 pg (ref 26.0–34.0)
MCHC: 33.2 g/dL (ref 32.0–36.0)
MCV: 89.2 fL (ref 80.0–100.0)
Platelets: 267 10*3/uL (ref 150–440)
RBC: 5.3 MIL/uL (ref 4.40–5.90)
RDW: 14.6 % — AB (ref 11.5–14.5)
WBC: 13.9 10*3/uL — ABNORMAL HIGH (ref 3.8–10.6)

## 2015-01-19 LAB — COMPREHENSIVE METABOLIC PANEL
ALBUMIN: 4.2 g/dL (ref 3.5–5.0)
ALT: 14 U/L — AB (ref 17–63)
AST: 18 U/L (ref 15–41)
Alkaline Phosphatase: 47 U/L (ref 38–126)
Anion gap: 5 (ref 5–15)
BUN: 9 mg/dL (ref 6–20)
CHLORIDE: 109 mmol/L (ref 101–111)
CO2: 25 mmol/L (ref 22–32)
CREATININE: 1.13 mg/dL (ref 0.61–1.24)
Calcium: 9.2 mg/dL (ref 8.9–10.3)
GFR calc non Af Amer: >60 mL/min (ref >60)
GLUCOSE: 106 mg/dL — AB (ref 65–99)
Potassium: 3.8 mmol/L (ref 3.5–5.1)
SODIUM: 139 mmol/L (ref 135–145)
Total Bilirubin: 0.4 mg/dL (ref 0.3–1.2)
Total Protein: 7.2 g/dL (ref 6.5–8.1)

## 2015-01-19 LAB — URINALYSIS COMPLETE WITH MICROSCOPIC (ARMC ONLY)
BACTERIA UA: NONE SEEN
Bilirubin Urine: NEGATIVE
GLUCOSE, UA: NEGATIVE mg/dL
Hgb urine dipstick: NEGATIVE
Ketones, ur: NEGATIVE mg/dL
Leukocytes, UA: NEGATIVE
Nitrite: NEGATIVE
PROTEIN: NEGATIVE mg/dL
SPECIFIC GRAVITY, URINE: 1.004 — AB (ref 1.005–1.030)
SQUAMOUS EPITHELIAL / LPF: NONE SEEN
pH: 6 (ref 5.0–8.0)

## 2015-01-19 LAB — LIPASE, BLOOD: LIPASE: 22 U/L (ref 11–51)

## 2015-01-19 MED ORDER — IBUPROFEN 600 MG PO TABS
ORAL_TABLET | ORAL | Status: AC
  Administered 2015-01-19: 600 mg via ORAL
  Filled 2015-01-19: qty 1

## 2015-01-19 MED ORDER — IBUPROFEN 400 MG PO TABS
600.0000 mg | ORAL_TABLET | Freq: Once | ORAL | Status: AC
  Administered 2015-01-19: 600 mg via ORAL

## 2015-01-19 NOTE — ED Notes (Signed)
Pt discharged home after verbalizing understanding of discharge instructions; nad noted. 

## 2015-01-19 NOTE — ED Notes (Signed)
Pt arrives to ED via POV c/o lower abdominal pain "off and on for a while", worsening over weekend and causing pt lose sleep. Denies NVD. Pt alert and oriented X4, active, cooperative, pt in NAD. RR even and unlabored, color WNL.

## 2015-01-19 NOTE — ED Provider Notes (Signed)
Time Seen: Approximately *1500 I have reviewed the triage notes  Chief Complaint: Abdominal Pain   History of Present Illness: Jimmy Thompson is a 39 y.o. male is a pleasant gentleman who presents with chronic intermittent lower middle quadrant abdominal pain. He states the pain is worse whenever he is doing some heavy lifting at work and also when he bends over to lace up his boots. He denies any testicular pain or masses. Denies any penile discharge or drainage. He denies any unusual weight loss or night sweats. Patient states the pain is been worse over the last several days.   Past Medical History  Diagnosis Date  . Seizures (HCC)   . MI (myocardial infarction) (HCC)     There are no active problems to display for this patient.   History reviewed. No pertinent past surgical history.  History reviewed. No pertinent past surgical history.  Current Outpatient Rx  Name  Route  Sig  Dispense  Refill  . cyclobenzaprine (FLEXERIL) 10 MG tablet   Oral   Take 1 tablet (10 mg total) by mouth every 8 (eight) hours as needed for muscle spasms.   30 tablet   1   . HYDROcodone-acetaminophen (NORCO) 5-325 MG per tablet   Oral   Take 1-2 tablets by mouth every 4 (four) hours as needed for moderate pain.   12 tablet   0   . ibuprofen (ADVIL,MOTRIN) 800 MG tablet   Oral   Take 1 tablet (800 mg total) by mouth every 8 (eight) hours as needed.   30 tablet   0     Allergies:  Review of patient's allergies indicates no known allergies.  Family History: No family history on file.  Social History: Social History  Substance Use Topics  . Smoking status: Current Every Day Smoker  . Smokeless tobacco: None  . Alcohol Use: Yes     Review of Systems:   10 point review of systems was performed and was otherwise negative:  Constitutional: No fever Eyes: No visual disturbances ENT: No sore throat, ear pain Cardiac: No chest pain Respiratory: No shortness of breath,  wheezing, or stridor Abdomen: No abdominal pain, no vomiting, No diarrhea Endocrine: No weight loss, No night sweats Extremities: No peripheral edema, cyanosis Skin: No rashes, easy bruising Neurologic: No focal weakness, trouble with speech or swollowing Urologic: No dysuria, Hematuria, or urinary frequency   Physical Exam:  ED Triage Vitals  Enc Vitals Group     BP 01/19/15 1154 116/70 mmHg     Pulse Rate 01/19/15 1154 78     Resp 01/19/15 1154 16     Temp 01/19/15 1154 98 F (36.7 C)     Temp Source 01/19/15 1154 Oral     SpO2 01/19/15 1154 100 %     Weight 01/19/15 1154 200 lb (90.719 kg)     Height 01/19/15 1154 6' (1.829 m)     Head Cir --      Peak Flow --      Pain Score 01/19/15 1155 10     Pain Loc --      Pain Edu? --      Excl. in GC? --     General: Awake , Alert , and Oriented times 3; GCS 15 Head: Normal cephalic , atraumatic Eyes: Pupils equal , round, reactive to light Nose/Throat: No nasal drainage, patent upper airway without erythema or exudate.  Neck: Supple, Full range of motion, No anterior adenopathy or palpable thyroid masses  Lungs: Clear to ascultation without wheezes , rhonchi, or rales Heart: Regular rate, regular rhythm without murmurs , gallops , or rubs Abdomen: Mild tenderness in the left lower quadrant without rebound, guarding , or rigidity; bowel sounds positive and symmetric in all 4 quadrants. No organomegaly .        Extremities: 2 plus symmetric pulses. No edema, clubbing or cyanosis Neurologic: normal ambulation, Motor symmetric without deficits, sensory intact Skin: warm, dry, no rashes Palpable genital masses with normal testes located in the scrotal sac with no penile discharge or drainage no palpable hernia  Labs:   All laboratory work was reviewed including any pertinent negatives or positives listed below:  Labs Reviewed  COMPREHENSIVE METABOLIC PANEL - Abnormal; Notable for the following:    Glucose, Bld 106 (*)    ALT 14  (*)    All other components within normal limits  CBC - Abnormal; Notable for the following:    WBC 13.9 (*)    RDW 14.6 (*)    All other components within normal limits  URINALYSIS COMPLETEWITH MICROSCOPIC ([facility] ONLY) - Abnormal; Notable for the following:    Color, Urine STRAW (*)    APPearance CLEAR (*)    Specific Gravity, Urine 1.004 (*)    All other components within normal limits  LIPASE, BLOOD      Radiology:  EXAM: CT ABDOMEN AND PELVIS WITHOUT CONTRAST  TECHNIQUE: Multidetector CT imaging of the abdomen and pelvis was performed following the standard protocol without IV contrast.  COMPARISON: 11/29/2013  FINDINGS: Lower chest: Normal.  Hepatobiliary: Normal.  Pancreas: Normal.  Spleen: Normal.  Adrenals/Urinary Tract: Normal.  Stomach/Bowel: Normal.  Vascular/Lymphatic: No significant abnormality.  Reproductive: Normal.  Other: No free air or free fluid.  Musculoskeletal: Normal.  IMPRESSION: Benign appearing abdomen.     I personally reviewed the radiologic studies     ED Course:  Patient's stay here was uneventful is given some ibuprofen for lower abdominal pain. Given his clinical presentation I felt this may be an abdominal hernia versus diverticulitis. No visible abnormalities per his CAT scan indicating a kidney stone. Patient was reassured that we did not find a reason to place him in the hospital for surgical consultation.    Assessment:  Acute exacerbation of chronic intermittent lower abdominal pain Possible hernia Final Clinical Impression:  Final diagnoses:  Abdominal pain, left lower quadrant     Plan: * Outpatient management patient was referred to surgery unassigned Patient was advised to return immediately if condition worsens. Patient was advised to follow up with their primary care physician or other specialized physicians involved in their outpatient care             Jennye Moccasin, MD 01/19/15  1714

## 2015-01-19 NOTE — Discharge Instructions (Signed)

## 2015-01-28 ENCOUNTER — Emergency Department: Payer: Self-pay

## 2015-01-28 ENCOUNTER — Encounter: Payer: Self-pay | Admitting: Emergency Medicine

## 2015-01-28 ENCOUNTER — Emergency Department
Admission: EM | Admit: 2015-01-28 | Discharge: 2015-01-28 | Disposition: A | Discharge status: Home / Self Care | Payer: Self-pay | Attending: Emergency Medicine | Admitting: Emergency Medicine

## 2015-01-28 DIAGNOSIS — K529 Noninfective gastroenteritis and colitis, unspecified: Secondary | ICD-10-CM

## 2015-01-28 DIAGNOSIS — F17200 Nicotine dependence, unspecified, uncomplicated: Secondary | ICD-10-CM | POA: Insufficient documentation

## 2015-01-28 LAB — COMPREHENSIVE METABOLIC PANEL
ALBUMIN: 3.7 g/dL (ref 3.5–5.0)
ALK PHOS: 44 U/L (ref 38–126)
ALT: 12 U/L — AB (ref 17–63)
AST: 14 U/L — AB (ref 15–41)
Anion gap: 5 (ref 5–15)
BILIRUBIN TOTAL: 0.4 mg/dL (ref 0.3–1.2)
BUN: 18 mg/dL (ref 6–20)
CALCIUM: 8.7 mg/dL — AB (ref 8.9–10.3)
CO2: 27 mmol/L (ref 22–32)
CREATININE: 1.05 mg/dL (ref 0.61–1.24)
Chloride: 108 mmol/L (ref 101–111)
GFR calc Af Amer: >60 mL/min (ref >60)
GLUCOSE: 98 mg/dL (ref 65–99)
Potassium: 4.2 mmol/L (ref 3.5–5.1)
Sodium: 140 mmol/L (ref 135–145)
TOTAL PROTEIN: 5.9 g/dL — AB (ref 6.5–8.1)

## 2015-01-28 LAB — CBC
HEMATOCRIT: 45.1 % (ref 40.0–52.0)
Hemoglobin: 14.9 g/dL (ref 13.0–18.0)
MCH: 29 pg (ref 26.0–34.0)
MCHC: 33 g/dL (ref 32.0–36.0)
MCV: 88 fL (ref 80.0–100.0)
PLATELETS: 236 10*3/uL (ref 150–440)
RBC: 5.13 MIL/uL (ref 4.40–5.90)
RDW: 14.8 % — AB (ref 11.5–14.5)
WBC: 13.4 10*3/uL — ABNORMAL HIGH (ref 3.8–10.6)

## 2015-01-28 LAB — URINALYSIS COMPLETE WITH MICROSCOPIC (ARMC ONLY)
BACTERIA UA: NONE SEEN
Bilirubin Urine: NEGATIVE
GLUCOSE, UA: NEGATIVE mg/dL
Hgb urine dipstick: NEGATIVE
KETONES UR: NEGATIVE mg/dL
Leukocytes, UA: NEGATIVE
NITRITE: NEGATIVE
PROTEIN: NEGATIVE mg/dL
Specific Gravity, Urine: 1.011 (ref 1.005–1.030)
Squamous Epithelial / LPF: NONE SEEN
WBC, UA: NONE SEEN WBC/hpf (ref 0–5)
pH: 9 — ABNORMAL HIGH (ref 5.0–8.0)

## 2015-01-28 LAB — LIPASE, BLOOD: Lipase: 26 U/L (ref 11–51)

## 2015-01-28 MED ORDER — METRONIDAZOLE 500 MG PO TABS: 500.0000 mg | ORAL_TABLET | Freq: Three times a day (TID) | ORAL | Status: AC

## 2015-01-28 MED ORDER — IOHEXOL 240 MG/ML SOLN: 25.0000 mL | Freq: Once | INTRAMUSCULAR | Status: DC | PRN

## 2015-01-28 MED ORDER — IOHEXOL 300 MG/ML  SOLN
100.0000 mL | Freq: Once | INTRAMUSCULAR | Status: AC | PRN
  Administered 2015-01-28: 100 mL via INTRAVENOUS

## 2015-01-28 MED ORDER — CIPROFLOXACIN HCL 500 MG PO TABS
500.0000 mg | ORAL_TABLET | Freq: Once | ORAL | Status: AC
  Administered 2015-01-28: 500 mg via ORAL
  Filled 2015-01-28: qty 1

## 2015-01-28 MED ORDER — OXYCODONE-ACETAMINOPHEN 5-325 MG PO TABS: 1.0000 | ORAL_TABLET | ORAL | Status: DC | PRN

## 2015-01-28 MED ORDER — MORPHINE SULFATE (PF) 4 MG/ML IV SOLN
4.0000 mg | Freq: Once | INTRAVENOUS | Status: AC
  Administered 2015-01-28: 4 mg via INTRAVENOUS
  Filled 2015-01-28: qty 1

## 2015-01-28 MED ORDER — CIPROFLOXACIN HCL 500 MG PO TABS: 500.0000 mg | ORAL_TABLET | Freq: Two times a day (BID) | ORAL | Status: AC

## 2015-01-28 MED ORDER — METRONIDAZOLE 500 MG PO TABS
500.0000 mg | ORAL_TABLET | Freq: Once | ORAL | Status: AC
  Administered 2015-01-28: 500 mg via ORAL
  Filled 2015-01-28: qty 1

## 2015-01-28 NOTE — ED Notes (Signed)
MD at bedside. 

## 2015-01-28 NOTE — ED Provider Notes (Signed)
[facility] Emergency Department Provider Note  ____________________________________________  Time seen: Approximately 12:28 PM  I have reviewed the triage vital signs and the nursing notes.   HISTORY  Chief Complaint Abdominal Pain    HPI Jimmy Thompson is a 40 y.o. male patient reports one week of left lower quadrant abdominal pain. He reports it comes on after he eats about 15 or 20 minutes later severe and crampy in nature. Sometimes he can stretch out in bed make it better other times it hits him and he doubles  up in a ball. No history of nausea or vomiting. Patient had a CT without contrast recently for abdominal pain which was unrevealing. Patient denies any fever. He reports that the pain lasts for a while and then goes away.   Past Medical History  Diagnosis Date  . Seizures (HCC)   . MI (myocardial infarction) (HCC)     There are no active problems to display for this patient.   History reviewed. No pertinent past surgical history.  Current Outpatient Rx  Name  Route  Sig  Dispense  Refill  . ciprofloxacin (CIPRO) 500 MG tablet   Oral   Take 1 tablet (500 mg total) by mouth 2 (two) times daily.   20 tablet   0   . cyclobenzaprine (FLEXERIL) 10 MG tablet   Oral   Take 1 tablet (10 mg total) by mouth every 8 (eight) hours as needed for muscle spasms. Patient not taking: Reported on 01/28/2015   30 tablet   1   . HYDROcodone-acetaminophen (NORCO) 5-325 MG per tablet   Oral   Take 1-2 tablets by mouth every 4 (four) hours as needed for moderate pain. Patient not taking: Reported on 01/28/2015   12 tablet   0   . ibuprofen (ADVIL,MOTRIN) 800 MG tablet   Oral   Take 1 tablet (800 mg total) by mouth every 8 (eight) hours as needed. Patient not taking: Reported on 01/28/2015   30 tablet   0   . metroNIDAZOLE (FLAGYL) 500 MG tablet   Oral   Take 1 tablet (500 mg total) by mouth 3 (three) times daily.   42 tablet   0   .  oxyCODONE-acetaminophen (ROXICET) 5-325 MG tablet   Oral   Take 1 tablet by mouth every 4 (four) hours as needed for severe pain.   10 tablet   0     Allergies Review of patient's allergies indicates no known allergies.  No family history on file.  Social History Social History  Substance Use Topics  . Smoking status: Current Every Day Smoker  . Smokeless tobacco: None  . Alcohol Use: Yes    Review of Systems Constitutional: No fever/chills Eyes: No visual changes. ENT: No sore throat. Cardiovascular: Denies chest pain. Respiratory: Denies shortness of breath. Gastrointestinal: See history of present illness Genitourinary: Negative for dysuria. Musculoskeletal: Negative for back pain. Skin: Negative for rash. Neurological: Negative for headaches, focal weakness or numbness.  10-point ROS otherwise negative.  ____________________________________________   PHYSICAL EXAM:  VITAL SIGNS: ED Triage Vitals  Enc Vitals Group     BP 01/28/15 1013 116/70 mmHg     Pulse Rate 01/28/15 1013 73     Resp 01/28/15 1013 18     Temp 01/28/15 1013 98.2 F (36.8 C)     Temp Source 01/28/15 1013 Oral     SpO2 01/28/15 1013 99 %     Weight 01/28/15 1013 212 lb (96.163 kg)  Height 01/28/15 1013 6' (1.829 m)     Head Cir --      Peak Flow --      Pain Score 01/28/15 1013 10     Pain Loc --      Pain Edu? --      Excl. in GC? --     Constitutional: Alert and oriented. Well appearing and in no acute distress. Eyes: Conjunctivae are normal. PERRL. EOMI. Head: Atraumatic. Nose: No congestion/rhinnorhea. Mouth/Throat: Mucous membranes are moist.  Oropharynx non-erythematous. Neck: No stridor.  Cardiovascular: Normal rate, regular rhythm. Grossly normal heart sounds.  Good peripheral circulation. Respiratory: Normal respiratory effort.  No retractions. Lungs CTAB. Gastrointestinal: Soft tender to palpation and percussion in the left lower quadrant.. No distention. No  abdominal bruits. No CVA tenderness. Musculoskeletal: No lower extremity tenderness nor edema.  No joint effusions. Neurologic:  Normal speech and language. No gross focal neurologic deficits are appreciated. No gait instability. Skin:  Skin is warm, dry and intact. No rash noted. Psychiatric: Mood and affect are normal. Speech and behavior are normal.  ____________________________________________   LABS (all labs ordered are listed, but only abnormal results are displayed)  Labs Reviewed  COMPREHENSIVE METABOLIC PANEL - Abnormal; Notable for the following:    Calcium 8.7 (*)    Total Protein 5.9 (*)    AST 14 (*)    ALT 12 (*)    All other components within normal limits  CBC - Abnormal; Notable for the following:    WBC 13.4 (*)    RDW 14.8 (*)    All other components within normal limits  URINALYSIS COMPLETEWITH MICROSCOPIC ([facility] ONLY) - Abnormal; Notable for the following:    Color, Urine STRAW (*)    APPearance TURBID (*)    pH 9.0 (*)    All other components within normal limits  LIPASE, BLOOD   ____________________________________________  EKG  EKG read and interpreted by me shows normal sinus rhythm a rate of 75 normal axis there is some PR segment depression most notable in lead 23F and V4 5 and 6. Patient does not have any chest pain or shortness of breath. ____________________________________________  RADIOLOGY  CT per radiology shows proximal enteritis.  ____________________________________________   PROCEDURES  ____________________________________________   INITIAL IMPRESSION / ASSESSMENT AND PLAN / ED COURSE  Pertinent labs & imaging results that were available during my care of the patient were reviewed by me and considered in my medical decision making (see chart for details).  Discussed with Dr. wall on call for gastroenterology. He feels this could be viral or possibly bacterial we will treat with Cipro and Flagyl patient should get better  shortly will t have him return if he is worse ____________________________________________   FINAL CLINICAL IMPRESSION(S) / ED DIAGNOSES  Final diagnoses:  Enteritis      Arnaldo Natal, MD 01/28/15 2131

## 2015-01-28 NOTE — ED Notes (Signed)
Patient transported to CT 

## 2015-01-28 NOTE — ED Notes (Signed)
EMS pt , on and off vomiting , left severe abd pain, no urinary symptoms, increasing episodes of dizziness and difficulty breathing. No distress noted on arrival

## 2015-07-29 ENCOUNTER — Emergency Department
Admission: EM | Admit: 2015-07-29 | Discharge: 2015-07-29 | Disposition: A | Discharge status: Home / Self Care | Payer: Self-pay | Attending: Student | Admitting: Student

## 2015-07-29 ENCOUNTER — Emergency Department: Payer: Self-pay

## 2015-07-29 DIAGNOSIS — F1290 Cannabis use, unspecified, uncomplicated: Secondary | ICD-10-CM | POA: Insufficient documentation

## 2015-07-29 DIAGNOSIS — R0789 Other chest pain: Secondary | ICD-10-CM

## 2015-07-29 DIAGNOSIS — Z8669 Personal history of other diseases of the nervous system and sense organs: Secondary | ICD-10-CM | POA: Insufficient documentation

## 2015-07-29 DIAGNOSIS — J208 Acute bronchitis due to other specified organisms: Secondary | ICD-10-CM | POA: Insufficient documentation

## 2015-07-29 DIAGNOSIS — J069 Acute upper respiratory infection, unspecified: Secondary | ICD-10-CM

## 2015-07-29 DIAGNOSIS — F17210 Nicotine dependence, cigarettes, uncomplicated: Secondary | ICD-10-CM | POA: Insufficient documentation

## 2015-07-29 LAB — COMPREHENSIVE METABOLIC PANEL
ALK PHOS: 61 U/L (ref 38–126)
ALT: 28 U/L (ref 17–63)
ANION GAP: 6 (ref 5–15)
AST: 27 U/L (ref 15–41)
Albumin: 4 g/dL (ref 3.5–5.0)
BILIRUBIN TOTAL: 0.3 mg/dL (ref 0.3–1.2)
BUN: 13 mg/dL (ref 6–20)
CALCIUM: 8.8 mg/dL — AB (ref 8.9–10.3)
CO2: 25 mmol/L (ref 22–32)
Chloride: 104 mmol/L (ref 101–111)
Creatinine, Ser: 0.94 mg/dL (ref 0.61–1.24)
GFR calc non Af Amer: >60 mL/min (ref >60)
Glucose, Bld: 92 mg/dL (ref 65–99)
Potassium: 4.5 mmol/L (ref 3.5–5.1)
Sodium: 135 mmol/L (ref 135–145)
TOTAL PROTEIN: 7.5 g/dL (ref 6.5–8.1)

## 2015-07-29 LAB — CBC WITH DIFFERENTIAL/PLATELET
BASOS ABS: 0.1 10*3/uL (ref 0–0.1)
Basophils Relative: 1 %
Eosinophils Absolute: 0.4 10*3/uL (ref 0–0.7)
Eosinophils Relative: 3 %
HEMATOCRIT: 43.2 % (ref 40.0–52.0)
HEMOGLOBIN: 15 g/dL (ref 13.0–18.0)
LYMPHS PCT: 12 %
Lymphs Abs: 1.3 10*3/uL (ref 1.0–3.6)
MCH: 30.5 pg (ref 26.0–34.0)
MCHC: 34.7 g/dL (ref 32.0–36.0)
MCV: 87.9 fL (ref 80.0–100.0)
MONO ABS: 0.8 10*3/uL (ref 0.2–1.0)
MONOS PCT: 7 %
NEUTROS ABS: 8.8 10*3/uL — AB (ref 1.4–6.5)
NEUTROS PCT: 77 %
Platelets: 254 10*3/uL (ref 150–440)
RBC: 4.92 MIL/uL (ref 4.40–5.90)
RDW: 15.3 % — ABNORMAL HIGH (ref 11.5–14.5)
WBC: 11.4 10*3/uL — ABNORMAL HIGH (ref 3.8–10.6)

## 2015-07-29 LAB — TROPONIN I: Troponin I: <0.03 ng/mL (ref <0.03)

## 2015-07-29 MED ORDER — IBUPROFEN 600 MG PO TABS
600.0000 mg | ORAL_TABLET | Freq: Once | ORAL | Status: AC
Start: 2015-07-29 — End: 2015-07-29
  Administered 2015-07-29: 600 mg via ORAL
  Filled 2015-07-29: qty 1

## 2015-07-29 MED ORDER — ALBUTEROL SULFATE HFA 108 (90 BASE) MCG/ACT IN AERS: 1.0000 | INHALATION_SPRAY | Freq: Four times a day (QID) | RESPIRATORY_TRACT | Status: DC | PRN

## 2015-07-29 MED ORDER — IBUPROFEN 600 MG PO TABS: 600.0000 mg | ORAL_TABLET | Freq: Four times a day (QID) | ORAL | Status: DC | PRN

## 2015-07-29 MED ORDER — ACETAMINOPHEN 500 MG PO TABS
1000.0000 mg | ORAL_TABLET | Freq: Once | ORAL | Status: AC
  Administered 2015-07-29: 1000 mg via ORAL
  Filled 2015-07-29: qty 2

## 2015-07-29 MED ORDER — BENZONATATE 100 MG PO CAPS: 100.0000 mg | ORAL_CAPSULE | Freq: Three times a day (TID) | ORAL | Status: DC | PRN

## 2015-07-29 NOTE — ED Notes (Signed)
Pt c/o cough with congestion, left lateral rib pain with periods of dizziness for the past week..Marland Kitchen

## 2015-07-29 NOTE — ED Provider Notes (Signed)
[facility] Emergency Department Provider Note   ____________________________________________  Time seen: Approximately 8:48 AM  I have reviewed the triage vital signs and the nursing notes.   HISTORY  Chief Complaint Cough and Dizziness    HPI Jimmy SatJeffrey M Tipler is a 40 y.o. male with history of seizures who presents for evaluation of one week productive cough, nasal congestion, chills, myalgias as well as left chest pain with cough, mild sob, gradual onset, constant, no modifying factors, currently moderate. He is endorsing subjective fevers and chills. No vomiting or diarrhea. Chest pain is nonradiating, nonexertional and nonpleuritic. He reports that he intermittent has felt lightheaded over the timeframe but denies any room spinning dizziness. He denies any history of coronary artery disease, no risk factors for PE including no hemoptysis, estrogen use, recent surgeries or recent prolonged period of immobilization.    PMH: seizures Patient denies history of MI There are no active problems to display for this patient.   History reviewed. No pertinent past surgical history.  Current Outpatient Rx  Name  Route  Sig  Dispense  Refill  . pseudoephedrine (SUDAFED) 30 MG tablet   Oral   Take 30 mg by mouth every 6 (six) hours as needed for congestion.           Allergies Review of patient's allergies indicates no known allergies.  No family history on file.  Social History Social History  Substance Use Topics  . Smoking status: Current Every Day Smoker    Types: Cigarettes  . Smokeless tobacco: None  . Alcohol Use: Yes    Review of Systems Constitutional: +subjective fever/chills Eyes: No visual changes. ENT: No sore throat. Cardiovascular: +chest pain with cough. Respiratory: +shortness of breath. Gastrointestinal: No abdominal pain.  No nausea, no vomiting.  No diarrhea.  No constipation. Genitourinary: Negative for  dysuria. Musculoskeletal: Negative for back pain. Skin: Negative for rash. Neurological: Negative for headaches, focal weakness or numbness.  10-point ROS otherwise negative.  ____________________________________________   PHYSICAL EXAM:  VITAL SIGNS: ED Triage Vitals  Enc Vitals Group     BP 07/29/15 0846 127/72 mmHg     Pulse Rate 07/29/15 0846 76     Resp 07/29/15 0846 18     Temp 07/29/15 0846 98.1 F (36.7 C)     Temp Source 07/29/15 0846 Oral     SpO2 07/29/15 0846 98 %     Weight 07/29/15 0846 220 lb (99.791 kg)     Height 07/29/15 0846 6' (1.829 m)     Head Cir --      Peak Flow --      Pain Score 07/29/15 0846 10     Pain Loc --      Pain Edu? --      Excl. in GC? --     Constitutional: Alert and oriented. Well appearing and in no acute distress. Eyes: Conjunctivae are normal. PERRL. EOMI. Head: Atraumatic. Nose: No congestion/rhinnorhea. Mouth/Throat: Mucous membranes are moist.  Oropharynx non-erythematous. Neck: No stridor.  Supple without meningismus. Cardiovascular: Normal rate, regular rhythm. Grossly normal heart sounds.  Good peripheral circulation. Respiratory: Normal respiratory effort.  No retractions. Lungs CTAB. Gastrointestinal: Soft and nontender. No distention.  No CVA tenderness. Genitourinary: Deferred Musculoskeletal: No lower extremity tenderness nor edema.  No joint effusions. swelling/tenderness/asymmetry. Point tenderness to palpation in the left anterolateral chest wall, the patient grimaces with palpation of the area and attempts to move away from the painful stimulus. Neurologic:  Normal speech and  language. No gross focal neurologic deficits are appreciated. No gait instability. Skin:  Skin is warm, dry and intact. No rash noted. Psychiatric: Mood and affect are normal. Speech and behavior are normal.  ____________________________________________   LABS (all labs ordered are listed, but only abnormal results are displayed)  Labs  Reviewed  CBC WITH DIFFERENTIAL/PLATELET - Abnormal; Notable for the following:    WBC 11.4 (*)    RDW 15.3 (*)    Neutro Abs 8.8 (*)    All other components within normal limits  COMPREHENSIVE METABOLIC PANEL - Abnormal; Notable for the following:    Calcium 8.8 (*)    All other components within normal limits  TROPONIN I   ____________________________________________  EKG  ED ECG REPORT I, Gayla DossGayle, Chitara Clonch A, the attending physician, personally viewed and interpreted this ECG.   Date: 07/29/2015  EKG Time: 08:52  Rate: 76  Rhythm: normal sinus rhythm, normal EKG.  Axis: normal  Intervals:none  ST&T Change: No acute ST elevation MI.  ____________________________________________  RADIOLOGY  CXR  IMPRESSION: No active cardiopulmonary disease.  ____________________________________________   PROCEDURES  Procedure(s) performed: None  Procedures  Critical Care performed: No  ____________________________________________   INITIAL IMPRESSION / ASSESSMENT AND PLAN / ED COURSE  Pertinent labs & imaging results that were available during my care of the patient were reviewed by me and considered in my medical decision making (see chart for details).  Jimmy Thompson is a 40 y.o. male with history of seizures who presents for evaluation of one week productive cough, nasal congestion, chills, myalgias as well as left chest pain with cough, mild sob. On exam, he is very well-appearing and in no acute distress. His vital signs are stable, he is afebrile. No tachypnea, no hypoxia, no increased work of breathing, clear breath sounds bilaterally. He has reproducible point tenderness to palpation in the left chest wall and I suspect musculoskeletal chest pain. ERC negative and I doubt PE. Not consistent with acute aortic dissection. Suspect viral syndrome, possibly even viral bronchitis given his constellation of symptoms. EKG is reassuring, troponin negative and I doubt ACS.  Unremarkable CBC with the exception of mild leukocytosis, normal CMP. Chest x-ray clear. We'll DC with an albuterol inhaler as well as instructions to take Motrin/Tylenol for pain. Discussed return precautions, need for close PCP follow-up and he is comfortable with the discharge plan. DC home. ____________________________________________   FINAL CLINICAL IMPRESSION(S) / ED DIAGNOSES  Final diagnoses:  URI, acute  Acute bronchitis, viral  Chest wall pain      NEW MEDICATIONS STARTED DURING THIS VISIT:  New Prescriptions   No medications on file     Note:  This document was prepared using Dragon voice recognition software and may include unintentional dictation errors.    Gayla DossEryka A Lennell Shanks, MD 07/29/15 1121

## 2015-07-29 NOTE — ED Notes (Signed)
Pt to ED today with cough, congestion, chills, left rib pain and dizziness over the last week.  Pt did not take his temp at home, but reports feeling "hot one minute and freezing the next".

## 2016-02-12 ENCOUNTER — Encounter: Payer: Self-pay | Admitting: Emergency Medicine

## 2016-02-12 ENCOUNTER — Emergency Department
Admission: EM | Admit: 2016-02-12 | Discharge: 2016-02-12 | Disposition: A | Discharge status: Home / Self Care | Payer: Self-pay | Attending: Emergency Medicine | Admitting: Emergency Medicine

## 2016-02-12 DIAGNOSIS — F17210 Nicotine dependence, cigarettes, uncomplicated: Secondary | ICD-10-CM | POA: Insufficient documentation

## 2016-02-12 DIAGNOSIS — F17290 Nicotine dependence, other tobacco product, uncomplicated: Secondary | ICD-10-CM | POA: Insufficient documentation

## 2016-02-12 DIAGNOSIS — M546 Pain in thoracic spine: Secondary | ICD-10-CM

## 2016-02-12 DIAGNOSIS — M545 Low back pain, unspecified: Secondary | ICD-10-CM

## 2016-02-12 DIAGNOSIS — Z791 Long term (current) use of non-steroidal anti-inflammatories (NSAID): Secondary | ICD-10-CM | POA: Insufficient documentation

## 2016-02-12 MED ORDER — CYCLOBENZAPRINE HCL 5 MG PO TABS: 5.0000 mg | ORAL_TABLET | Freq: Three times a day (TID) | ORAL | 0 refills | Status: DC | PRN

## 2016-02-12 MED ORDER — NAPROXEN 500 MG PO TBEC: 500.0000 mg | DELAYED_RELEASE_TABLET | Freq: Two times a day (BID) | ORAL | 0 refills | Status: DC

## 2016-02-12 NOTE — ED Notes (Signed)

## 2016-02-12 NOTE — Discharge Instructions (Signed)
Your exam is normal today. There is no evidence of any serious injury or disc injury. You should take the prescription meds as directed. Apply moist heat or ice to reduce symptoms. [facility].

## 2016-02-12 NOTE — ED Triage Notes (Signed)
States mid back pain. No known injury.

## 2016-02-12 NOTE — ED Notes (Signed)
Pt c/o mid back pain with no injury. Pt is neurologically intact, ambulatory with no difficulty but states difficulty leaning down. NAD noted, skin warm, dry, and intact, respirations even and unlabored.

## 2016-02-13 NOTE — ED Provider Notes (Signed)
[facility] Emergency Department Provider Note ____________________________________________  Time seen: 1514  I have reviewed the triage vital signs and the nursing notes.  HISTORY  Chief Complaint  Back Pain  HPI Jimmy Thompson is a 41 y.o. male presents to the ED for evaluation of back pain on the right without known injury, accident, trauma. Patient denies any history of ongoing back pain. This report to working over the last few days as normal but denies any particular injury, accident, or trauma related to his work activities. He further denies any chest pain, shortness of breath, or hemoptysis. Patient reports taking Tylenol and Motrin with limited benefit for symptoms.  Past Medical History:  Diagnosis Date  . MI (myocardial infarction)   . Seizures (HCC)     There are no active problems to display for this patient.   History reviewed. No pertinent surgical history.  Prior to Admission medications   Medication Sig Start Date End Date Taking? Authorizing Provider  albuterol (PROVENTIL HFA;VENTOLIN HFA) 108 (90 Base) MCG/ACT inhaler Inhale 1-2 puffs into the lungs every 6 (six) hours as needed for wheezing or shortness of breath. 07/29/15   Gayla DossEryka A Gayle, MD  benzonatate (TESSALON PERLES) 100 MG capsule Take 1 capsule (100 mg total) by mouth 3 (three) times daily as needed for cough. 07/29/15   Gayla DossEryka A Gayle, MD  cyclobenzaprine (FLEXERIL) 5 MG tablet Take 1 tablet (5 mg total) by mouth 3 (three) times daily as needed for muscle spasms. 02/12/16   Merl Guardino V Bacon Davius Goudeau, PA-C  ibuprofen (ADVIL,MOTRIN) 600 MG tablet Take 1 tablet (600 mg total) by mouth every 6 (six) hours as needed for moderate pain. 07/29/15   Gayla DossEryka A Gayle, MD  naproxen (EC NAPROSYN) 500 MG EC tablet Take 1 tablet (500 mg total) by mouth 2 (two) times daily with a meal. 02/12/16   Raidon Swanner V Bacon Kartik Fernando, PA-C  pseudoephedrine (SUDAFED) 30 MG tablet Take 30 mg by mouth every 6 (six) hours as  needed for congestion.    Historical Provider, MD    Allergies Patient has no known allergies.  History reviewed. No pertinent family history.  Social History Social History  Substance Use Topics  . Smoking status: Current Every Day Smoker    Packs/day: 0.50    Types: Cigarettes  . Smokeless tobacco: Current User    Types: Chew  . Alcohol use Yes     Comment: fridays and saturdays    Review of Systems  Constitutional: Negative for fever. Cardiovascular: Negative for chest pain. Respiratory: Negative for shortness of breath. Gastrointestinal: Negative for abdominal pain, vomiting and diarrhea. Genitourinary: Negative for dysuria. Musculoskeletal: Positive for back pain. Skin: Negative for rash. Neurological: Negative for headaches, focal weakness or numbness. ____________________________________________  PHYSICAL EXAM:  VITAL SIGNS: ED Triage Vitals  Enc Vitals Group     BP 02/12/16 1442 (!) 146/81     Pulse Rate 02/12/16 1442 80     Resp 02/12/16 1442 16     Temp 02/12/16 1442 97.7 F (36.5 C)     Temp Source 02/12/16 1442 Oral     SpO2 02/12/16 1442 99 %     Weight 02/12/16 1442 218 lb (98.9 kg)     Height 02/12/16 1442 6' (1.829 m)     Head Circumference --      Peak Flow --      Pain Score 02/12/16 1446 10     Pain Loc --      Pain Edu? --  Excl. in GC? --     Constitutional: Alert and oriented. Well appearing and in no distress. Head: Normocephalic and atraumatic. Cardiovascular: Normal rate, regular rhythm. Normal distal pulses. Respiratory: Normal respiratory effort. No wheezes/rales/rhonchi. Gastrointestinal: Soft and nontender. No distention.No CVA tenderness appreciated. Musculoskeletal: Multiple spinal alignment without midline tenderness, spasm, deformity, or step-off. Patient with normal upper extremities range of motion and normal rotator cuff testing. Patient localized tenderness to the right thoracolumbar region. Patient with normal lumbar  flexion and extension range and exam. Nontender with normal range of motion in all extremities.  Neurologic: Cranial nerves II through XII grossly intact. Normal UE/LE DTRs bilaterally. Normal gait without ataxia. Normal speech and language. No gross focal neurologic deficits are appreciated. Skin:  Skin is warm, dry and intact. No rash noted. Psychiatric: Mood and affect are normal. Patient exhibits appropriate insight and judgment. ____________________________________________  INITIAL IMPRESSION / ASSESSMENT AND PLAN / ED COURSE  Patient with a benign exam without any acute no muscle deficit. He appears to have a right-sided thoracolumbar muscle strain. As such she'll be discharged with prescription is for cyclobenzaprine and EC Naprosyn. He will follow-up with his primary care provider or [facility] for ongoing worsening symptoms.  ____________________________________________  FINAL CLINICAL IMPRESSION(S) / ED DIAGNOSES  Final diagnoses:  Thoracolumbar back pain      Lissa Hoard, PA-C 02/13/16 0010    Arnaldo Natal, MD 02/13/16 [phone]

## 2016-02-28 ENCOUNTER — Emergency Department
Admission: EM | Admit: 2016-02-28 | Discharge: 2016-02-28 | Disposition: A | Discharge status: Home / Self Care | Payer: Self-pay | Attending: Emergency Medicine | Admitting: Emergency Medicine

## 2016-02-28 ENCOUNTER — Encounter: Payer: Self-pay | Admitting: Emergency Medicine

## 2016-02-28 DIAGNOSIS — Z79899 Other long term (current) drug therapy: Secondary | ICD-10-CM | POA: Insufficient documentation

## 2016-02-28 DIAGNOSIS — F17210 Nicotine dependence, cigarettes, uncomplicated: Secondary | ICD-10-CM | POA: Insufficient documentation

## 2016-02-28 DIAGNOSIS — R112 Nausea with vomiting, unspecified: Secondary | ICD-10-CM | POA: Insufficient documentation

## 2016-02-28 DIAGNOSIS — R1032 Left lower quadrant pain: Secondary | ICD-10-CM | POA: Insufficient documentation

## 2016-02-28 DIAGNOSIS — F17220 Nicotine dependence, chewing tobacco, uncomplicated: Secondary | ICD-10-CM | POA: Insufficient documentation

## 2016-02-28 DIAGNOSIS — M25512 Pain in left shoulder: Secondary | ICD-10-CM | POA: Insufficient documentation

## 2016-02-28 DIAGNOSIS — I252 Old myocardial infarction: Secondary | ICD-10-CM | POA: Insufficient documentation

## 2016-02-28 LAB — COMPREHENSIVE METABOLIC PANEL
ALT: 21 U/L (ref 17–63)
AST: 22 U/L (ref 15–41)
Albumin: 4.2 g/dL (ref 3.5–5.0)
Alkaline Phosphatase: 47 U/L (ref 38–126)
Anion gap: 6 (ref 5–15)
BUN: 7 mg/dL (ref 6–20)
CO2: 30 mmol/L (ref 22–32)
Calcium: 9.1 mg/dL (ref 8.9–10.3)
Chloride: 102 mmol/L (ref 101–111)
Creatinine, Ser: 1.04 mg/dL (ref 0.61–1.24)
GFR calc Af Amer: >60 mL/min (ref >60)
GFR calc non Af Amer: >60 mL/min (ref >60)
Glucose, Bld: 88 mg/dL (ref 65–99)
Potassium: 4.8 mmol/L (ref 3.5–5.1)
Sodium: 138 mmol/L (ref 135–145)
Total Bilirubin: 0.6 mg/dL (ref 0.3–1.2)
Total Protein: 7.2 g/dL (ref 6.5–8.1)

## 2016-02-28 LAB — TROPONIN I: Troponin I: <0.03 ng/mL (ref <0.03)

## 2016-02-28 LAB — CBC
HCT: 42.9 % (ref 40.0–52.0)
HEMOGLOBIN: 14.9 g/dL (ref 13.0–18.0)
MCH: 30.5 pg (ref 26.0–34.0)
MCHC: 34.7 g/dL (ref 32.0–36.0)
MCV: 87.8 fL (ref 80.0–100.0)
Platelets: 277 10*3/uL (ref 150–440)
RBC: 4.89 MIL/uL (ref 4.40–5.90)
RDW: 14.6 % — AB (ref 11.5–14.5)
WBC: 9.7 10*3/uL (ref 3.8–10.6)

## 2016-02-28 LAB — LIPASE, BLOOD: Lipase: 27 U/L (ref 11–51)

## 2016-02-28 MED ORDER — ONDANSETRON HCL 4 MG PO TABS: 4.0000 mg | ORAL_TABLET | Freq: Three times a day (TID) | ORAL | 0 refills | Status: DC | PRN

## 2016-02-28 MED ORDER — DICYCLOMINE HCL 20 MG PO TABS
20.0000 mg | ORAL_TABLET | Freq: Three times a day (TID) | ORAL | 0 refills | Status: DC | PRN
Start: 2016-02-28 — End: 2016-07-16

## 2016-02-28 NOTE — ED Triage Notes (Signed)
Pt to ED from home c/o LLQ pain and left shoulder pain since Saturday.  Reports n/v x2 today, decreased appetite, denies injury.

## 2016-02-28 NOTE — Discharge Instructions (Signed)
Please seek medical attention for any high fevers, chest pain, shortness of breath, change in behavior, persistent vomiting, bloody stool or any other new or concerning symptoms.  

## 2016-02-28 NOTE — ED Notes (Signed)
Pt to ed from home with c/o LLQ pain and left shoulder pain that started on Saturday.  pt reports n/v x2 today, decreased appetite, denies injury, appears in nad.  Pt placed on stretcher in hallway,  BS positive x 4.  Pt alert and oriented, skin warm and dry.

## 2016-02-28 NOTE — ED Provider Notes (Signed)
[facility] Emergency Department Provider Note   ____________________________________________   I have reviewed the triage vital signs and the nursing notes.   HISTORY  Chief Complaint Abdominal Pain and Arm Pain   History limited by: Not Limited   HPI Jimmy Thompson is a 41 y.o. male who presents to the emergency department today because of concern for abdominal pain. The patient states it is located in the left lower quadrant. It is sharp in nature. The patient states that he has not had any diarrhea. Some nausea and vomiting. The patient has had similar symptoms in the past. The patient has not followed up with any gi doctor since being seen for this a couple of times in the emergency department in the past. In addition the patient has had intermittent left shoulder pain. This radiates down his left arm. It has been going on for the past month.    Past Medical History:  Diagnosis Date  . MI (myocardial infarction)   . Seizures (HCC)     There are no active problems to display for this patient.   History reviewed. No pertinent surgical history.  Prior to Admission medications   Medication Sig Start Date End Date Taking? Authorizing Provider  albuterol (PROVENTIL HFA;VENTOLIN HFA) 108 (90 Base) MCG/ACT inhaler Inhale 1-2 puffs into the lungs every 6 (six) hours as needed for wheezing or shortness of breath. 07/29/15   Gayla Doss, MD  benzonatate (TESSALON PERLES) 100 MG capsule Take 1 capsule (100 mg total) by mouth 3 (three) times daily as needed for cough. 07/29/15   Gayla Doss, MD  cyclobenzaprine (FLEXERIL) 5 MG tablet Take 1 tablet (5 mg total) by mouth 3 (three) times daily as needed for muscle spasms. 02/12/16   Jenise V Bacon Menshew, PA-C  ibuprofen (ADVIL,MOTRIN) 600 MG tablet Take 1 tablet (600 mg total) by mouth every 6 (six) hours as needed for moderate pain. 07/29/15   Gayla Doss, MD  naproxen (EC NAPROSYN) 500 MG EC tablet Take 1 tablet  (500 mg total) by mouth 2 (two) times daily with a meal. 02/12/16   Jenise V Bacon Menshew, PA-C  pseudoephedrine (SUDAFED) 30 MG tablet Take 30 mg by mouth every 6 (six) hours as needed for congestion.    Historical Provider, MD    Allergies Patient has no known allergies.  History reviewed. No pertinent family history.  Social History Social History  Substance Use Topics  . Smoking status: Current Every Day Smoker    Packs/day: 0.50    Types: Cigarettes  . Smokeless tobacco: Current User    Types: Chew  . Alcohol use Yes     Comment: fridays and saturdays    Review of Systems  Constitutional: Negative for fever. Cardiovascular: Negative for chest pain. Respiratory: Negative for shortness of breath. Gastrointestinal: Positive for left lower quadrant pain. Genitourinary: Negative for dysuria. Musculoskeletal: Positive for left shoulder pain. Skin: Negative for rash. Neurological: Negative for headaches, focal weakness or numbness.  10-point ROS otherwise negative.  ____________________________________________   PHYSICAL EXAM:  VITAL SIGNS: ED Triage Vitals  Enc Vitals Group     BP 02/28/16 1354 135/78     Pulse Rate 02/28/16 1354 74     Resp 02/28/16 1354 16     Temp 02/28/16 1354 98.2 F (36.8 C)     Temp Source 02/28/16 1354 Oral     SpO2 02/28/16 1354 98 %     Weight 02/28/16 1354 220 lb (99.8 kg)  Height 02/28/16 1354 6' (1.829 m)     Head Circumference --      Peak Flow --      Pain Score 02/28/16 1357 10   Constitutional: Alert and oriented. Well appearing and in no distress. Eyes: Conjunctivae are normal. Normal extraocular movements. ENT   Head: Normocephalic and atraumatic.   Nose: No congestion/rhinnorhea.   Mouth/Throat: Mucous membranes are moist.   Neck: No stridor. Hematological/Lymphatic/Immunilogical: No cervical lymphadenopathy. Cardiovascular: Normal rate, regular rhythm.  No murmurs, rubs, or gallops. Respiratory: Normal  respiratory effort without tachypnea nor retractions. Breath sounds are clear and equal bilaterally. No wheezes/rales/rhonchi. Gastrointestinal: Soft and minimal tenderness to palpation somewhat diffusely throughout the abdomen. No rebound. No guarding.  Genitourinary: Deferred Musculoskeletal: Normal range of motion in all extremities. No lower extremity edema. Neurologic:  Normal speech and language. No gross focal neurologic deficits are appreciated.  Skin:  Skin is warm, dry and intact. No rash noted. Psychiatric: Mood and affect are normal. Speech and behavior are normal. Patient exhibits appropriate insight and judgment.  ____________________________________________    LABS (pertinent positives/negatives)  Labs Reviewed  CBC - Abnormal; Notable for the following:       Result Value   RDW 14.6 (*)    All other components within normal limits  LIPASE, BLOOD  COMPREHENSIVE METABOLIC PANEL  TROPONIN I  URINALYSIS, COMPLETE (UACMP) WITH MICROSCOPIC     ____________________________________________   EKG  I, Phineas SemenGraydon Antwaun Buth, attending physician, personally viewed and interpreted this EKG  EKG Time: 1405 Rate: 64 Rhythm: normal sinus rhythm Axis: normal Intervals: qtc 396 QRS: narrow ST changes: no st elevation Impression: normal ekg   ____________________________________________    RADIOLOGY  None  ____________________________________________   PROCEDURES  Procedures  ____________________________________________   INITIAL IMPRESSION / ASSESSMENT AND PLAN / ED COURSE  Pertinent labs & imaging results that were available during my care of the patient were reviewed by me and considered in my medical decision making (see chart for details).  Patient present to the emergency department today because of concern for left lower quadrant abdominal pain. The patient has been seen in the emergency department for this same complaint a couple of times in the past  with multiple CT scans. On exam today mild diffuse tenderness, no rebound or guarding. No leukocytosis. Given negative work ups in the past (one CT scan with possible enteritis, otherwise no concerning findings) do not feel repeat emergent imaging is necessary at this time. Will plan on giving bentyl and antiemetics and have patient follow up with GI. In addition think intermittent left shoulder pain likely MSK in nature.  ____________________________________________   FINAL CLINICAL IMPRESSION(S) / ED DIAGNOSES  Final diagnoses:  Left lower quadrant pain  Left shoulder pain, unspecified chronicity     Note: This dictation was prepared with Dragon dictation. Any transcriptional errors that result from this process are unintentional     Phineas SemenGraydon Josee Speece, MD 02/28/16 1642

## 2016-03-26 ENCOUNTER — Ambulatory Visit: Payer: Self-pay | Admitting: Gastroenterology

## 2016-04-19 ENCOUNTER — Ambulatory Visit: Payer: Self-pay | Admitting: Gastroenterology

## 2016-05-17 ENCOUNTER — Ambulatory Visit: Payer: Self-pay | Admitting: Gastroenterology

## 2016-07-16 ENCOUNTER — Other Ambulatory Visit: Payer: Self-pay

## 2016-07-16 ENCOUNTER — Encounter: Payer: Self-pay | Admitting: Emergency Medicine

## 2016-07-16 ENCOUNTER — Emergency Department: Payer: Self-pay

## 2016-07-16 ENCOUNTER — Emergency Department
Admission: EM | Admit: 2016-07-16 | Discharge: 2016-07-16 | Disposition: A | Discharge status: Home / Self Care | Payer: Self-pay | Attending: Emergency Medicine | Admitting: Emergency Medicine

## 2016-07-16 DIAGNOSIS — I252 Old myocardial infarction: Secondary | ICD-10-CM | POA: Insufficient documentation

## 2016-07-16 DIAGNOSIS — F17210 Nicotine dependence, cigarettes, uncomplicated: Secondary | ICD-10-CM | POA: Insufficient documentation

## 2016-07-16 DIAGNOSIS — J22 Unspecified acute lower respiratory infection: Secondary | ICD-10-CM | POA: Insufficient documentation

## 2016-07-16 MED ORDER — PREDNISONE 50 MG PO TABS: 50.0000 mg | ORAL_TABLET | Freq: Every day | ORAL | 0 refills | Status: DC

## 2016-07-16 MED ORDER — BENZONATATE 200 MG PO CAPS: 200.0000 mg | ORAL_CAPSULE | Freq: Three times a day (TID) | ORAL | 0 refills | Status: DC | PRN

## 2016-07-16 MED ORDER — AZITHROMYCIN 250 MG PO TABS: ORAL_TABLET | ORAL | 0 refills | Status: DC

## 2016-07-16 NOTE — ED Notes (Signed)
See triage note. Presents with prod cough which started about 1 week ago   Unsure of fever  Afebrile on arrival

## 2016-07-16 NOTE — ED Provider Notes (Signed)
[facility] Emergency Department Provider Note  ____________________________________________  Time seen: Approximately 3:45 PM  I have reviewed the triage vital signs and the nursing notes.   HISTORY  Chief Complaint Cough and Shortness of Breath    HPI Jimmy Thompson is a 41 y.o. male who presents to the emergency department complaining of a weeklong history of coughing, chest tightness, shortness of breath. Patient reports that symptoms began with a cough, mostly dry that is now more productive. Patient reports over the past couple days he started to experience some chest tightness and shortness of breath associated with his cough. Patient reports subjective tactile fever. He denies any headache, visual changes, difficulty breathing, wheezing, abdominal pain, nausea or vomiting. Patient is been taking Tylenol and Motrin at home. Patient does have a history of previous MI, 8 years ago. Patient does endorse chest tightness and some shortness of breath but states that symptoms are not consistent with previous MI. No other complaints at this time.   Past Medical History:  Diagnosis Date  . MI (myocardial infarction) (HCC)   . Seizures (HCC)     There are no active problems to display for this patient.   History reviewed. No pertinent surgical history.  Prior to Admission medications   Medication Sig Start Date End Date Taking? Authorizing Provider  azithromycin (ZITHROMAX Z-PAK) 250 MG tablet Take 2 tablets (500 mg) on  Day 1,  followed by 1 tablet (250 mg) once daily on Days 2 through 5. 07/16/16   Anita Mcadory, Delorise Royals, PA-C  benzonatate (TESSALON) 200 MG capsule Take 1 capsule (200 mg total) by mouth 3 (three) times daily as needed for cough. 07/16/16   Coal Nearhood, Delorise Royals, PA-C  predniSONE (DELTASONE) 50 MG tablet Take 1 tablet (50 mg total) by mouth daily with breakfast. 07/16/16   Tiffannie Sloss, Delorise Royals, PA-C  pseudoephedrine (SUDAFED) 30 MG tablet Take  30 mg by mouth every 6 (six) hours as needed for congestion.    [provider]    Allergies Patient has no known allergies.  No family history on file.  Social History Social History  Substance Use Topics  . Smoking status: Current Every Day Smoker    Packs/day: 0.50    Types: Cigarettes  . Smokeless tobacco: Current User    Types: Chew  . Alcohol use Yes     Comment: fridays and saturdays     Review of Systems  Constitutional: No fever/chills Eyes: No visual changes. No discharge ENT: No upper respiratory complaints. Cardiovascular: Patient describes chest "tightness" but no frank chest pain Respiratory: Positive cough. Positive SOB. Gastrointestinal: No abdominal pain.  No nausea, no vomiting.  No diarrhea.  No constipation. Musculoskeletal: Negative for musculoskeletal pain. Skin: Negative for rash, abrasions, lacerations, ecchymosis. Neurological: Negative for headaches, focal weakness or numbness. 10-point ROS otherwise negative.  ____________________________________________   PHYSICAL EXAM:  VITAL SIGNS: ED Triage Vitals [07/16/16 1530]  Enc Vitals Group     BP 136/88     Pulse Rate 87     Resp 16     Temp 97.8 F (36.6 C)     Temp Source Oral     SpO2      Weight 210 lb (95.3 kg)     Height 6' (1.829 m)     Head Circumference      Peak Flow      Pain Score 0     Pain Loc      Pain Edu?  Excl. in GC?      Constitutional: Alert and oriented. Well appearing and in no acute distress. Eyes: Conjunctivae are normal. PERRL. EOMI. Head: Atraumatic. ENT:      Ears: EACs and TMs unremarkable bilaterally.      Nose: No congestion/rhinnorhea.      Mouth/Throat: Mucous membranes are moist. Oropharynx is nonerythematous and nonedematous. Uvula is midline. Neck: No stridor.   Hematological/Lymphatic/Immunilogical: No cervical lymphadenopathy. Cardiovascular: Normal rate, regular rhythm. Normal S1 and S2. No murmurs, rubs, gallops.  Good  peripheral circulation. Respiratory: Normal respiratory effort without tachypnea or retractions. Lungs with a few crackles and bilateral lower lung fields, no rales or rhonchi. No wheezing.Peri [place]. Good air entry to the bases with no decreased or absent breath sounds. Musculoskeletal: Full range of motion to all extremities. No gross deformities appreciated. Neurologic:  Normal speech and language. No gross focal neurologic deficits are appreciated.  Skin:  Skin is warm, dry and intact. No rash noted. Psychiatric: Mood and affect are normal. Speech and behavior are normal. Patient exhibits appropriate insight and judgement.   ____________________________________________   LABS (all labs ordered are listed, but only abnormal results are displayed)  Labs Reviewed - No data to display ____________________________________________  EKG  EKG reveals normal sinus rhythm at a rate of 74 bpm. No ST elevations or depressions noted. PR, QRS, QT interval within normal limits. Normal axis. Inverted P-wave in V1 consistent with possible left atrial enlargement. No Q waves are delta waves identified. Normal EKG. ____________________________________________  RADIOLOGY Festus BarrenI, Olie Dibert D Lilyannah Zuelke, personally viewed and evaluated these images (plain radiographs) as part of my medical decision making, as well as reviewing the written report by the radiologist.  Dg Chest 2 View  Result Date: 07/16/2016 CLINICAL DATA:  Adducted cough for the past week. History of previous MI. Current smoker. EXAM: CHEST  2 VIEW COMPARISON:  Chest x-ray of July 29, 2015 FINDINGS: The lungs are well-expanded and clear. There is no alveolar infiltrate or pleural effusion. The heart and pulmonary vascularity are normal. The mediastinum is normal in width. The bony thorax exhibits no acute abnormality. IMPRESSION: There is no pneumonia nor other acute cardiopulmonary abnormality. Electronically Signed   By: David  SwazilandJordan M.D.   On: 07/16/2016  16:20    ____________________________________________    PROCEDURES  Procedure(s) performed:    Procedures    Medications - No data to display   ____________________________________________   INITIAL IMPRESSION / ASSESSMENT AND PLAN / ED COURSE  Pertinent labs & imaging results that were available during my care of the patient were reviewed by me and considered in my medical decision making (see chart for details).  Review of the [place] CSRS was performed in accordance of the [facility] prior to dispensing any controlled drugs.     Patient's diagnosis is consistent with lower respiratory infection. Patient's chest x-ray and EKG returned with reassuring results. No indication of effusion significant for pneumonia. Patient likely has bronchitis with a bacterial component as he has worsened over the past 2-3 days. Patient will be treated with Zithromax, prednisone, Tessalon Perles.. Patient will follow up with primary care as needed. Patient is given ED precautions to return to the ED for any worsening or new symptoms.     ____________________________________________  FINAL CLINICAL IMPRESSION(S) / ED DIAGNOSES  Final diagnoses:  Lower respiratory infection      NEW MEDICATIONS STARTED DURING THIS VISIT:  New Prescriptions   AZITHROMYCIN (ZITHROMAX Z-PAK) 250 MG TABLET    Take 2 tablets (500  mg) on  Day 1,  followed by 1 tablet (250 mg) once daily on Days 2 through 5.   BENZONATATE (TESSALON) 200 MG CAPSULE    Take 1 capsule (200 mg total) by mouth 3 (three) times daily as needed for cough.   PREDNISONE (DELTASONE) 50 MG TABLET    Take 1 tablet (50 mg total) by mouth daily with breakfast.        This chart was dictated using voice recognition software/Dragon. Despite best efforts to proofread, errors can occur which can change the meaning. Any change was purely unintentional.    Racheal Patches, PA-C 07/16/16 1634    Phineas Semen, MD 07/16/16 [phone]

## 2016-07-16 NOTE — ED Triage Notes (Signed)
C/O productive cough x 1 week.  States coughing productively for thick phlegm.

## 2016-08-27 ENCOUNTER — Emergency Department
Admission: EM | Admit: 2016-08-27 | Discharge: 2016-08-27 | Disposition: A | Discharge status: Home / Self Care | Payer: Self-pay | Attending: Emergency Medicine | Admitting: Emergency Medicine

## 2016-08-27 ENCOUNTER — Encounter: Payer: Self-pay | Admitting: Emergency Medicine

## 2016-08-27 DIAGNOSIS — F17220 Nicotine dependence, chewing tobacco, uncomplicated: Secondary | ICD-10-CM | POA: Insufficient documentation

## 2016-08-27 DIAGNOSIS — F17210 Nicotine dependence, cigarettes, uncomplicated: Secondary | ICD-10-CM | POA: Insufficient documentation

## 2016-08-27 DIAGNOSIS — W57XXXA Bitten or stung by nonvenomous insect and other nonvenomous arthropods, initial encounter: Secondary | ICD-10-CM | POA: Insufficient documentation

## 2016-08-27 DIAGNOSIS — B86 Scabies: Secondary | ICD-10-CM | POA: Insufficient documentation

## 2016-08-27 LAB — URINALYSIS, COMPLETE (UACMP) WITH MICROSCOPIC
BACTERIA UA: NONE SEEN
Bilirubin Urine: NEGATIVE
Glucose, UA: NEGATIVE mg/dL
Hgb urine dipstick: NEGATIVE
KETONES UR: 5 mg/dL — AB
Leukocytes, UA: NEGATIVE
Nitrite: NEGATIVE
PH: 7 (ref 5.0–8.0)
PROTEIN: NEGATIVE mg/dL
SQUAMOUS EPITHELIAL / LPF: NONE SEEN
Specific Gravity, Urine: 1.015 (ref 1.005–1.030)

## 2016-08-27 MED ORDER — DOXYCYCLINE HYCLATE 100 MG PO CAPS: 100.0000 mg | ORAL_CAPSULE | Freq: Two times a day (BID) | ORAL | 0 refills | Status: DC

## 2016-08-27 MED ORDER — IVERMECTIN 3 MG PO TABS: 150.0000 ug/kg | ORAL_TABLET | Freq: Once | ORAL | 0 refills | Status: DC

## 2016-08-27 MED ORDER — DOXYCYCLINE HYCLATE 100 MG PO TABS
100.0000 mg | ORAL_TABLET | Freq: Once | ORAL | Status: AC
  Administered 2016-08-27: 100 mg via ORAL
  Filled 2016-08-27: qty 1

## 2016-08-27 MED ORDER — DOXYCYCLINE HYCLATE 100 MG PO CAPS: 100.0000 mg | ORAL_CAPSULE | Freq: Two times a day (BID) | ORAL | 0 refills | Status: AC

## 2016-08-27 MED ORDER — IVERMECTIN 3 MG PO TABS: 150.0000 ug/kg | ORAL_TABLET | Freq: Once | ORAL | 0 refills | Status: AC

## 2016-08-27 MED ORDER — IVERMECTIN 3 MG PO TABS
150.0000 ug/kg | ORAL_TABLET | Freq: Once | ORAL | Status: AC
Start: 2016-08-27 — End: 2016-08-27
  Administered 2016-08-27: 15000 ug via ORAL
  Filled 2016-08-27: qty 5

## 2016-08-27 NOTE — ED Triage Notes (Signed)
p to ed with c/o ? Insect bite to right hand and then rash to right upper arm.

## 2016-08-27 NOTE — Discharge Instructions (Signed)
Take medication as prescribed. Return to emergency department if symptoms worsen and follow-up with PCP as needed.   °

## 2016-08-27 NOTE — ED Provider Notes (Signed)
[facility] Emergency Department Provider Note   ____________________________________________   I have reviewed the triage vital signs and the nursing notes.   HISTORY  Chief Complaint Rash    HPI Jimmy Thompson is a 41 y.o. male presents to emergency department with rash along the bilateral forearms, right hand, bilateral ankles and anterior lower abdomen patient notes onset of rash approximately one week ago. 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 Patient reports the rash originally started on the dorsal aspect of the right hand and then progressed up the right forearm then throughout the other areas noted above. Rash is minimally pruritic with erythema and a maculopapular presentation. Patient reports mild malaise and fatigue since onset of rash develop. Some areas are excoriated with scabbing. Patient denies fever, chills, headache, vision changes, chest pain, chest tightness, shortness of breath, abdominal pain, nausea and vomiting.  Past Medical History:  Diagnosis Date  . MI (myocardial infarction) (HCC)   . Seizures (HCC)     There are no active problems to display for this patient.   History reviewed. No pertinent surgical history.  Prior to Admission medications   Medication Sig Start Date End Date Taking? Authorizing Provider  azithromycin (ZITHROMAX Z-PAK) 250 MG tablet Take 2 tablets (500 mg) on  Day 1,  followed by 1 tablet (250 mg) once daily on Days 2 through 5. 07/16/16   Cuthriell, Delorise Royals, PA-C  benzonatate (TESSALON) 200 MG capsule Take 1 capsule (200 mg total) by mouth 3 (three) times daily as needed for cough. 07/16/16   Cuthriell, Delorise Royals, PA-C  doxycycline (VIBRAMYCIN) 100 MG capsule Take 1 capsule (100 mg total) by mouth 2 (two) times daily. 08/27/16 09/06/16  Little, Traci M, PA-C  ivermectin (STROMECTOL) 3 MG TABS tablet Take 5 tablets (15,000 mcg total) by  mouth once. 09/03/16 09/03/16  Little, Traci M, PA-C  predniSONE (DELTASONE) 50 MG tablet Take 1 tablet (50 mg total) by mouth daily with breakfast. 07/16/16   Cuthriell, Delorise Royals, PA-C  pseudoephedrine (SUDAFED) 30 MG tablet Take 30 mg by mouth every 6 (six) hours as needed for congestion.    [provider]    Allergies Patient has no known allergies.  History reviewed. No pertinent family history.  Social History Social History  Substance Use Topics  . Smoking status: Current Every Day Smoker    Packs/day: 0.50    Types: Cigarettes  . Smokeless tobacco: Current User    Types: Chew  . Alcohol use Yes     Comment: fridays and saturdays    Review of Systems Constitutional: Negative for fever/chills. Malaise Eyes: No visual changes. ENT:  Negative for sore throat and for difficulty swallowing Cardiovascular: Denies chest pain. Respiratory: Denies cough. Denies shortness of breath. Gastrointestinal: No abdominal pain.  No nausea, vomiting, diarrhea. Genitourinary: Negative for dysuria. Musculoskeletal: Negative for back pain. Skin: Positive for rash along bilateral upper and lower extremities and lower anterior abdomen.  Neurological: Negative for headaches.  Negative focal weakness or numbness. Negative for loss of consciousness. Able to ambulate. ____________________________________________   PHYSICAL EXAM:  VITAL SIGNS: ED Triage Vitals  Enc Vitals Group     BP --      Pulse Rate 08/27/16 1412 78     Resp 08/27/16 1412 18     Temp 08/27/16 1412 97.8 F (36.6 C)     Temp Source 08/27/16 1412 Oral     SpO2  08/27/16 1412 100 %     Weight 08/27/16 1413 215 lb (97.5 kg)     Height 08/27/16 1413 6' (1.829 m)     Head Circumference --      Peak Flow --      Pain Score 08/27/16 1412 10     Pain Loc --      Pain Edu? --      Excl. in GC? --     Constitutional: Alert and oriented. Well appearing and in no acute distress.  Eyes: Conjunctivae are normal. PERRL.  EOMI  Head: Normocephalic and atraumatic. ENT:      Ears: Canals clear. TMs intact bilaterally.      Nose: No congestion/rhinnorhea.      Mouth/Throat: Mucous membranes are moist.  Neck:Supple. No thyromegaly. No stridor.  Cardiovascular: Normal rate, regular rhythm. Normal S1 and S2.  Good peripheral circulation. Respiratory: Normal respiratory effort without tachypnea or retractions. Lungs CTAB. Good air entry to the bases with no decreased or absent breath sounds. Hematological/Lymphatic/Immunological: No cervical lymphadenopathy. Cardiovascular: Normal rate, regular rhythm. Normal distal pulses. Respiratory: Normal respiratory effort. No wheezes/rales/rhonchi. Lungs CTAB with no W/R/R. Gastrointestinal: Bowel sounds 4 quadrants. Soft and nontender to palpation. No guarding or rigidity. No palpable masses. No distention. No CVA tenderness. Musculoskeletal: Nontender with normal range of motion in all extremities. Neurologic: Normal speech and language. No gross focal neurologic deficits are appreciated. No gait instability. Cranial nerves: II-X intact. No sensory loss or abnormal reflexes.  Skin:  Skin is warm, dry and intact. Erythematous, macopapular rash with area of excoriation and occasional scabbing along right hand, right and left forearm and upper arm, anterior torso along belt line and bilateral lower legs and ankles.  Psychiatric: Mood and affect are normal. Speech and behavior are normal. Patient exhibits appropriate insight and judgement.  ____________________________________________   LABS (all labs ordered are listed, but only abnormal results are displayed)  Labs Reviewed  URINALYSIS, COMPLETE (UACMP) WITH MICROSCOPIC - Abnormal; Notable for the following:       Result Value   Color, Urine YELLOW (*)    APPearance CLOUDY (*)    Ketones, ur 5 (*)    All other components within normal limits    ____________________________________________  EKG none ____________________________________________  RADIOLOGY none ____________________________________________   PROCEDURES  Procedure(s) performed: no    Critical Care performed: no ____________________________________________   INITIAL IMPRESSION / ASSESSMENT AND PLAN / ED COURSE  Pertinent labs & imaging results that were available during my care of the patient were reviewed by me and considered in my medical decision making (see chart for details).   Patient presents to emergency department with erythematous macopapular rash along right hand, right and left forearm and upper arm, anterior torso along belt line and bilateral lower legs and ankles. History and physical exam findings are reassuring symptoms are consistent with likely scabies rash and exposure to a tick bite. Patient received the initial dose of ivermectin during the course of care in the emergency department. Patient will be prescribed the second dose of ivermectin to be be taken 7 days from now, in addition to, doxycycline for antibiotic coverage . Patient advised to follow up with PCP as needed or return to the emergency department if symptoms return or worsen.  ____________________________________________   FINAL CLINICAL IMPRESSION(S) / ED DIAGNOSES  Final diagnoses:  Scabies  Tick bite, initial encounter       NEW MEDICATIONS STARTED DURING THIS VISIT:  Discharge Medication List as of 08/27/2016  4:39 PM       Note:  This document was prepared using Dragon voice recognition software and may include unintentional dictation errors.    Little, Jordan Likesraci M, PA-C 08/27/16 2239    LittleJordan Likes, Traci M, PA-C 08/27/16 2242    Sharyn CreamerQuale, Mark, MD 08/28/16 [phone]

## 2016-08-27 NOTE — ED Notes (Signed)
Patient comes with complaints of burning rash to right forearm and painful scabbed over sore to right hand.  Rash has present for one week.  Patient has applied hydrocortisone cream with no improvement noted.  Patient denies taking anything by mouth.

## 2016-12-18 ENCOUNTER — Emergency Department: Payer: Self-pay

## 2016-12-18 ENCOUNTER — Other Ambulatory Visit: Payer: Self-pay

## 2016-12-18 ENCOUNTER — Encounter: Payer: Self-pay | Admitting: Emergency Medicine

## 2016-12-18 ENCOUNTER — Emergency Department
Admission: EM | Admit: 2016-12-18 | Discharge: 2016-12-18 | Disposition: A | Discharge status: Home / Self Care | Payer: Self-pay | Attending: Emergency Medicine | Admitting: Emergency Medicine

## 2016-12-18 DIAGNOSIS — F17210 Nicotine dependence, cigarettes, uncomplicated: Secondary | ICD-10-CM | POA: Insufficient documentation

## 2016-12-18 DIAGNOSIS — I252 Old myocardial infarction: Secondary | ICD-10-CM | POA: Insufficient documentation

## 2016-12-18 DIAGNOSIS — R0789 Other chest pain: Secondary | ICD-10-CM | POA: Insufficient documentation

## 2016-12-18 DIAGNOSIS — F17220 Nicotine dependence, chewing tobacco, uncomplicated: Secondary | ICD-10-CM | POA: Insufficient documentation

## 2016-12-18 DIAGNOSIS — Z79899 Other long term (current) drug therapy: Secondary | ICD-10-CM | POA: Insufficient documentation

## 2016-12-18 DIAGNOSIS — J189 Pneumonia, unspecified organism: Secondary | ICD-10-CM | POA: Insufficient documentation

## 2016-12-18 LAB — CBC
HEMATOCRIT: 47.6 % (ref 40.0–52.0)
HEMOGLOBIN: 16 g/dL (ref 13.0–18.0)
MCH: 30.2 pg (ref 26.0–34.0)
MCHC: 33.6 g/dL (ref 32.0–36.0)
MCV: 90 fL (ref 80.0–100.0)
Platelets: 293 10*3/uL (ref 150–440)
RBC: 5.29 MIL/uL (ref 4.40–5.90)
RDW: 15.2 % — ABNORMAL HIGH (ref 11.5–14.5)
WBC: 13.5 10*3/uL — ABNORMAL HIGH (ref 3.8–10.6)

## 2016-12-18 LAB — COMPREHENSIVE METABOLIC PANEL
ALBUMIN: 4.4 g/dL (ref 3.5–5.0)
ALK PHOS: 52 U/L (ref 38–126)
ALT: 19 U/L (ref 17–63)
AST: 21 U/L (ref 15–41)
Anion gap: 10 (ref 5–15)
BUN: 14 mg/dL (ref 6–20)
CALCIUM: 9.9 mg/dL (ref 8.9–10.3)
CO2: 25 mmol/L (ref 22–32)
CREATININE: 1.04 mg/dL (ref 0.61–1.24)
Chloride: 102 mmol/L (ref 101–111)
GFR calc non Af Amer: >60 mL/min (ref >60)
GLUCOSE: 104 mg/dL — AB (ref 65–99)
Potassium: 3.9 mmol/L (ref 3.5–5.1)
SODIUM: 137 mmol/L (ref 135–145)
Total Bilirubin: 0.7 mg/dL (ref 0.3–1.2)
Total Protein: 8.4 g/dL — ABNORMAL HIGH (ref 6.5–8.1)

## 2016-12-18 LAB — TROPONIN I: Troponin I: <0.03 ng/mL (ref <0.03)

## 2016-12-18 MED ORDER — AZITHROMYCIN 250 MG PO TABS: ORAL_TABLET | ORAL | 0 refills | Status: DC

## 2016-12-18 MED ORDER — GUAIFENESIN 100 MG/5ML PO SOLN: 5.0000 mL | ORAL | 0 refills | Status: DC | PRN

## 2016-12-18 MED ORDER — HYDROCODONE-HOMATROPINE 5-1.5 MG/5ML PO SYRP: 5.0000 mL | ORAL_SOLUTION | Freq: Four times a day (QID) | ORAL | 0 refills | Status: DC | PRN

## 2016-12-18 MED ORDER — NAPROXEN 500 MG PO TABS: 500.0000 mg | ORAL_TABLET | Freq: Two times a day (BID) | ORAL | 0 refills | Status: DC

## 2016-12-18 NOTE — ED Provider Notes (Signed)
[facility] Emergency Department Provider Note  ____________________________________________  Time seen: Approximately 6:49 PM  I have reviewed the triage vital signs and the nursing notes.   HISTORY  Chief Complaint Shortness of Breath    HPI Jimmy Thompson is a 41 y.o. male who complains of gradual onset of shortness of breath over the past week, associated with productive cough of thick sputum. Has chills. Has forceful coughing fits that time which have resulted in left chest wall pain. Not exertional, not pleuritic. No aggravating or alleviating factors other than the coughing. Symptoms are moderate intensity. Denies recent travel trauma hospitalization surgery or any history of DVT or PE. No vomiting or diaphoresis. Nonradiating.     Past Medical History:  Diagnosis Date  . MI (myocardial infarction) (HCC)   . Seizures (HCC)      There are no active problems to display for this patient.    History reviewed. No pertinent surgical history.   Prior to Admission medications   Medication Sig Start Date End Date Taking? Authorizing Provider  azithromycin (ZITHROMAX Z-PAK) 250 MG tablet Take 2 tablets (500 mg) on  Day 1,  followed by 1 tablet (250 mg) once daily on Days 2 through 5. 12/18/16   Sharman CheekStafford, Pink Maye, MD  benzonatate (TESSALON) 200 MG capsule Take 1 capsule (200 mg total) by mouth 3 (three) times daily as needed for cough. 07/16/16   Cuthriell, Delorise RoyalsJonathan D, PA-C  guaiFENesin (ROBITUSSIN) 100 MG/5ML SOLN Take 5 mLs (100 mg total) by mouth every 4 (four) hours as needed for cough or to loosen phlegm. 12/18/16   Sharman CheekStafford, Lulu Hirschmann, MD  HYDROcodone-homatropine [facility](HYCODAN) 5-1.5 MG/5ML syrup Take 5 mLs by mouth every 6 (six) hours as needed for cough. 12/18/16   Sharman CheekStafford, Taisley Mordan, MD  naproxen (NAPROSYN) 500 MG tablet Take 1 tablet (500 mg total) by mouth 2 (two) times daily with a meal. 12/18/16   Sharman CheekStafford, Jailynne Opperman, MD  predniSONE (DELTASONE) 50 MG  tablet Take 1 tablet (50 mg total) by mouth daily with breakfast. 07/16/16   Cuthriell, Delorise RoyalsJonathan D, PA-C  pseudoephedrine (SUDAFED) 30 MG tablet Take 30 mg by mouth every 6 (six) hours as needed for congestion.    [provider]     Allergies Patient has no known allergies.   History reviewed. No pertinent family history.  Social History Social History   Tobacco Use  . Smoking status: Current Every Day Smoker    Packs/day: 0.50    Types: Cigarettes  . Smokeless tobacco: Current User    Types: Chew  Substance Use Topics  . Alcohol use: Yes    Comment: fridays and saturdays  . Drug use: Yes    Types: Marijuana    Review of Systems  Constitutional:   No fever positive chills.  ENT:   No sore throat. No rhinorrhea. Cardiovascular:   No chest pain or syncope. Respiratory:   Positive shortness of breath and productive cough Gastrointestinal:   Negative for abdominal pain, vomiting and diarrhea.  Musculoskeletal:  Positive left chest wall pain All other systems reviewed and are negative except as documented above in ROS and HPI.  ____________________________________________   PHYSICAL EXAM:  VITAL SIGNS: ED Triage Vitals  Enc Vitals Group     BP 12/18/16 1543 140/80     Pulse Rate 12/18/16 1543 79     Resp 12/18/16 1543 16     Temp 12/18/16 1543 98.6 F (37 C)     Temp Source 12/18/16 1543 Oral  SpO2 12/18/16 1543 96 %     Weight 12/18/16 1538 220 lb (99.8 kg)     Height 12/18/16 1538 6' (1.829 m)     Head Circumference --      Peak Flow --      Pain Score 12/18/16 1537 10     Pain Loc --      Pain Edu? --      Excl. in GC? --     Vital signs reviewed, nursing assessments reviewed.   Constitutional:   Alert and oriented. Well appearing and in no distress. Eyes:   No scleral icterus.  EOMI. No nystagmus. No conjunctival pallor. PERRL. ENT   Head:   Normocephalic and atraumatic.   Nose:   No congestion/rhinnorhea.    Mouth/Throat:    MMM, no pharyngeal erythema. No peritonsillar mass.    Neck:   No meningismus. Full ROM. Hematological/Lymphatic/Immunilogical:   No cervical lymphadenopathy. Cardiovascular:   RRR. Symmetric bilateral radial and DP pulses.  No murmurs.  Respiratory:   Normal respiratory effort without tachypnea/retractions. Breath sounds are clear and equal bilaterally. No wheezes/rales/rhonchi. No inducible wheezing or coughing with FEV1 maneuver Gastrointestinal:   Soft and nontender. Non distended. There is no CVA tenderness.  No rebound, rigidity, or guarding. Genitourinary:   deferred Musculoskeletal:   Normal range of motion in all extremities. No joint effusions.  No lower extremity tenderness.  No edema. Left chest wall tender to the touch over the fifth rib in the midaxillary line, no crepitus or step-off or instability. Neurologic:   Normal speech and language.  Motor grossly intact. No gross focal neurologic deficits are appreciated.  Skin:    Skin is warm, dry and intact. No rash noted.  No petechiae, purpura, or bullae.  ____________________________________________    LABS (pertinent positives/negatives) (all labs ordered are listed, but only abnormal results are displayed) Labs Reviewed  CBC - Abnormal; Notable for the following components:      Result Value   WBC 13.5 (*)    RDW 15.2 (*)    All other components within normal limits  COMPREHENSIVE METABOLIC PANEL - Abnormal; Notable for the following components:   Glucose, Bld 104 (*)    Total Protein 8.4 (*)    All other components within normal limits  TROPONIN I   ____________________________________________   EKG  Interpreted by me  Date: 12/18/2016  Rate: 68  Rhythm: normal sinus rhythm  QRS Axis: normal  Intervals: normal  ST/T Wave abnormalities: normal  Conduction Disutrbances: none  Narrative Interpretation: unremarkable      ____________________________________________    RADIOLOGY  Dg Chest 2  View  Result Date: 12/18/2016 CLINICAL DATA:  Shortness of breath for 1 week. Cough. Left rib cage pain. EXAM: CHEST  2 VIEW COMPARISON:  07/16/2016 FINDINGS: Both lungs are clear without a pneumothorax. Heart and mediastinum are within normal limits. Trachea is midline. No pleural effusions. No acute bone abnormality. No large pleural effusions. IMPRESSION: No active cardiopulmonary disease. Electronically Signed   By: Richarda Overlie M.D.   On: 12/18/2016 16:02    ____________________________________________   PROCEDURES Procedures  ____________________________________________     CLINICAL IMPRESSION / ASSESSMENT AND PLAN / ED COURSE  Pertinent labs & imaging results that were available during my care of the patient were reviewed by me and considered in my medical decision making (see chart for details).   Patient well-appearing no acute distress, presents with left chest wall pain for the past week, associated shortness of breath and  productive cough. Exam is unremarkable, vitals are normal.Considering the patient's symptoms, medical history, and physical examination today, I have low suspicion for ACS, PE, TAD, pneumothorax, carditis, mediastinitis, CHF, or sepsis. I think his symptoms today are most consistent with a community-acquired pneumonia, atypical pneumonia, and I'll prescribe azithromycin along with other medications for symptomatic relief. He likely has a intercostal strain as well.        ____________________________________________   FINAL CLINICAL IMPRESSION(S) / ED DIAGNOSES    Final diagnoses:  Chest wall pain  Community acquired pneumonia of left lung, unspecified part of lung      This SmartLink is deprecated. Use AVSMEDLIST instead to display the medication list for a patient.   Portions of this note were generated with dragon dictation software. Dictation errors may occur despite best attempts at proofreading.    Sharman CheekStafford, Anayi Bricco, MD 12/18/16  [phone]

## 2016-12-18 NOTE — ED Triage Notes (Signed)
[facility] Has had cough as well. Pain to left rib cage.  Reports feels winded even putting shoes on.  Unlabored in triage.  No fevers.

## 2017-07-13 ENCOUNTER — Encounter: Payer: Self-pay | Admitting: Emergency Medicine

## 2017-07-13 ENCOUNTER — Other Ambulatory Visit: Payer: Self-pay

## 2017-07-13 ENCOUNTER — Emergency Department: Payer: Self-pay

## 2017-07-13 ENCOUNTER — Emergency Department
Admission: EM | Admit: 2017-07-13 | Discharge: 2017-07-13 | Disposition: A | Discharge status: Home / Self Care | Payer: Self-pay | Attending: Emergency Medicine | Admitting: Emergency Medicine

## 2017-07-13 DIAGNOSIS — Z79899 Other long term (current) drug therapy: Secondary | ICD-10-CM | POA: Insufficient documentation

## 2017-07-13 DIAGNOSIS — F17220 Nicotine dependence, chewing tobacco, uncomplicated: Secondary | ICD-10-CM | POA: Insufficient documentation

## 2017-07-13 DIAGNOSIS — F17210 Nicotine dependence, cigarettes, uncomplicated: Secondary | ICD-10-CM | POA: Insufficient documentation

## 2017-07-13 DIAGNOSIS — R1032 Left lower quadrant pain: Secondary | ICD-10-CM | POA: Insufficient documentation

## 2017-07-13 DIAGNOSIS — R1030 Lower abdominal pain, unspecified: Secondary | ICD-10-CM

## 2017-07-13 LAB — COMPREHENSIVE METABOLIC PANEL
ALK PHOS: 49 U/L (ref 38–126)
ALT: 24 U/L (ref 17–63)
AST: 26 U/L (ref 15–41)
Albumin: 4.7 g/dL (ref 3.5–5.0)
Anion gap: 11 (ref 5–15)
BILIRUBIN TOTAL: 0.6 mg/dL (ref 0.3–1.2)
BUN: 10 mg/dL (ref 6–20)
CALCIUM: 8.9 mg/dL (ref 8.9–10.3)
CO2: 20 mmol/L — ABNORMAL LOW (ref 22–32)
CREATININE: 0.83 mg/dL (ref 0.61–1.24)
Chloride: 103 mmol/L (ref 101–111)
Glucose, Bld: 90 mg/dL (ref 65–99)
Potassium: 4.1 mmol/L (ref 3.5–5.1)
Sodium: 134 mmol/L — ABNORMAL LOW (ref 135–145)
Total Protein: 8.3 g/dL — ABNORMAL HIGH (ref 6.5–8.1)

## 2017-07-13 LAB — URINALYSIS, COMPLETE (UACMP) WITH MICROSCOPIC
Bacteria, UA: NONE SEEN
Bilirubin Urine: NEGATIVE
GLUCOSE, UA: NEGATIVE mg/dL
Hgb urine dipstick: NEGATIVE
KETONES UR: 5 mg/dL — AB
Nitrite: NEGATIVE
PH: 5 (ref 5.0–8.0)
Protein, ur: NEGATIVE mg/dL
SPECIFIC GRAVITY, URINE: 1.016 (ref 1.005–1.030)
Squamous Epithelial / LPF: NONE SEEN (ref 0–5)

## 2017-07-13 LAB — CBC
HCT: 45.2 % (ref 40.0–52.0)
Hemoglobin: 15.6 g/dL (ref 13.0–18.0)
MCH: 30.5 pg (ref 26.0–34.0)
MCHC: 34.4 g/dL (ref 32.0–36.0)
MCV: 88.8 fL (ref 80.0–100.0)
PLATELETS: 286 10*3/uL (ref 150–440)
RBC: 5.09 MIL/uL (ref 4.40–5.90)
RDW: 14.8 % — ABNORMAL HIGH (ref 11.5–14.5)
WBC: 12.5 10*3/uL — AB (ref 3.8–10.6)

## 2017-07-13 LAB — LIPASE, BLOOD: Lipase: 28 U/L (ref 11–51)

## 2017-07-13 MED ORDER — IOPAMIDOL (ISOVUE-300) INJECTION 61%
30.0000 mL | Freq: Once | INTRAVENOUS | Status: AC | PRN
  Administered 2017-07-13: 30 mL via ORAL

## 2017-07-13 MED ORDER — SODIUM CHLORIDE 0.9 % IV SOLN
1000.0000 mL | Freq: Once | INTRAVENOUS | Status: AC
  Administered 2017-07-13: 1000 mL via INTRAVENOUS

## 2017-07-13 MED ORDER — ONDANSETRON HCL 4 MG/2ML IJ SOLN
4.0000 mg | Freq: Once | INTRAMUSCULAR | Status: AC
  Administered 2017-07-13: 4 mg via INTRAVENOUS
  Filled 2017-07-13: qty 2

## 2017-07-13 MED ORDER — NAPROXEN 500 MG PO TABS: 500.0000 mg | ORAL_TABLET | Freq: Two times a day (BID) | ORAL | 2 refills | Status: DC

## 2017-07-13 MED ORDER — CEPHALEXIN 500 MG PO CAPS: 500.0000 mg | ORAL_CAPSULE | Freq: Two times a day (BID) | ORAL | 0 refills | Status: DC

## 2017-07-13 MED ORDER — IOPAMIDOL (ISOVUE-300) INJECTION 61%
100.0000 mL | Freq: Once | INTRAVENOUS | Status: AC | PRN
  Administered 2017-07-13: 100 mL via INTRAVENOUS

## 2017-07-13 MED ORDER — MORPHINE SULFATE (PF) 4 MG/ML IV SOLN
4.0000 mg | Freq: Once | INTRAVENOUS | Status: AC
  Administered 2017-07-13: 4 mg via INTRAVENOUS
  Filled 2017-07-13: qty 1

## 2017-07-13 NOTE — ED Triage Notes (Signed)
Pt to ED via POV, pt states that he has been having LLQ abdominal pain x 1 week. Pt denies N/V/D or fever. Pt states that he has noticed a decrease in appetite. Pt is in NAD at this time.

## 2017-07-13 NOTE — ED Provider Notes (Signed)
[facility] Emergency Department Provider Note   ____________________________________________    I have reviewed the triage vital signs and the nursing notes.   HISTORY  Chief Complaint Abdominal Pain     HPI Jimmy Thompson is a 42 y.o. male who presents with complaints of moderate cramping abdominal pain.  Patient reports 1 week of lower abdominal pain, left greater than right.  He has not had any relief.  He reports it makes it difficult for him to sleep at night.  He is not take anything for this.  He is never had this before.  Denies fevers or chills.  Normal stools.  Some nausea.  Decreased p.o. appetite.   Past Medical History:  Diagnosis Date  . MI (myocardial infarction) (HCC)   . Seizures (HCC)     There are no active problems to display for this patient.   History reviewed. No pertinent surgical history.  Prior to Admission medications   Medication Sig Start Date End Date Taking? Authorizing Provider  albuterol (PROVENTIL HFA;VENTOLIN HFA) 108 (90 Base) MCG/ACT inhaler Inhale 2 puffs into the lungs every 6 (six) hours as needed for wheezing or shortness of breath.   Yes [provider]  cetirizine (ZYRTEC) 10 MG tablet Take 10 mg by mouth daily.   Yes [provider]  cephALEXin (KEFLEX) 500 MG capsule Take 1 capsule (500 mg total) by mouth 2 (two) times daily. 07/13/17   Jene EveryKinner, Yacqub Baston, MD  naproxen (NAPROSYN) 500 MG tablet Take 1 tablet (500 mg total) by mouth 2 (two) times daily with a meal. 07/13/17   Jene EveryKinner, Aqib Lough, MD     Allergies Patient has no known allergies.  No family history on file.  Social History Social History   Tobacco Use  . Smoking status: Current Every Day Smoker    Packs/day: 0.50    Types: Cigarettes  . Smokeless tobacco: Current User    Types: Chew  Substance Use Topics  . Alcohol use: Yes    Comment: fridays and saturdays  . Drug use: Yes    Types: Marijuana    Review of  Systems  Constitutional: No fever/chills Eyes: No visual changes.  ENT: No sore throat. Cardiovascular: Denies chest pain. Respiratory: Denies shortness of breath. Gastrointestinal: As above Genitourinary: Negative for dysuria. Musculoskeletal: Negative for back pain. Skin: Negative for rash. Neurological: Negative for headaches   ____________________________________________   PHYSICAL EXAM:  VITAL SIGNS: ED Triage Vitals  Enc Vitals Group     BP 07/13/17 1041 137/82     Pulse Rate 07/13/17 1041 92     Resp 07/13/17 1041 16     Temp 07/13/17 1041 98.6 F (37 C)     Temp Source 07/13/17 1041 Oral     SpO2 07/13/17 1041 96 %     Weight 07/13/17 1042 104.3 kg (230 lb)     Height 07/13/17 1042 1.829 m (6')     Head Circumference --      Peak Flow --      Pain Score 07/13/17 1042 10     Pain Loc --      Pain Edu? --      Excl. in GC? --     Constitutional: Alert and oriented. No acute distress. Pleasant and interactive Eyes: Conjunctivae are normal.   Nose: No congestion/rhinnorhea. Mouth/Throat: Mucous membranes are moist.    Cardiovascular: Normal rate, regular rhythm. Grossly normal heart sounds.  Good peripheral circulation. Respiratory: Normal respiratory effort.  No  retractions. Lungs CTAB. Gastrointestinal: Moderate tenderness palpation left greater than right, lower abdomen. No distention.  No CVA tenderness.  Musculoskeletal:   Warm and well perfused Neurologic:  Normal speech and language. No gross focal neurologic deficits are appreciated.  Skin:  Skin is warm, dry and intact. No rash noted. Psychiatric: Mood and affect are normal. Speech and behavior are normal.  ____________________________________________   LABS (all labs ordered are listed, but only abnormal results are displayed)  Labs Reviewed  COMPREHENSIVE METABOLIC PANEL - Abnormal; Notable for the following components:      Result Value   Sodium 134 (*)    CO2 20 (*)    Total Protein 8.3  (*)    All other components within normal limits  CBC - Abnormal; Notable for the following components:   WBC 12.5 (*)    RDW 14.8 (*)    All other components within normal limits  URINALYSIS, COMPLETE (UACMP) WITH MICROSCOPIC - Abnormal; Notable for the following components:   Color, Urine YELLOW (*)    APPearance CLEAR (*)    Ketones, ur 5 (*)    Leukocytes, UA TRACE (*)    All other components within normal limits  LIPASE, BLOOD   ____________________________________________  EKG   ____________________________________________  RADIOLOGY  CT abdomen pelvis negative for acute diverticulitis ____________________________________________   PROCEDURES  Procedure(s) performed: No  Procedures   Critical Care performed: No ____________________________________________   INITIAL IMPRESSION / ASSESSMENT AND PLAN / ED COURSE  Pertinent labs & imaging results that were available during my care of the patient were reviewed by me and considered in my medical decision making (see chart for details).  Presents with lower abdominal pain, left greater than right, differential includes diverticulitis, colitis, appendicitis.  Will treat with IV morphine, IV Zofran obtain CT abdomen pelvis and reevaluate.  Labs are overall reassuring except mild elevation white blood cell count which is not specific.  Urinalysis demonstrates trace leukocytes  CT abdomen pelvis is unremarkable  Suspicion for UTI as the cause of his lower abdominal pain, will treat with Keflex, naproxen, outpatient follow-up recommended    ____________________________________________   FINAL CLINICAL IMPRESSION(S) / ED DIAGNOSES  Final diagnoses:  Lower abdominal pain        Note:  This document was prepared using Dragon voice recognition software and may include unintentional dictation errors.   Jene Every, MD 07/13/17 1459

## 2017-07-13 NOTE — ED Notes (Signed)
ED Provider at bedside. 

## 2017-07-13 NOTE — ED Notes (Signed)
Patient transported to CT 

## 2018-12-21 ENCOUNTER — Other Ambulatory Visit: Payer: Self-pay

## 2018-12-21 ENCOUNTER — Encounter: Payer: Self-pay | Admitting: Emergency Medicine

## 2018-12-21 ENCOUNTER — Emergency Department
Admission: EM | Admit: 2018-12-21 | Discharge: 2018-12-21 | Disposition: A | Discharge status: Home / Self Care | Payer: Self-pay | Attending: Emergency Medicine | Admitting: Emergency Medicine

## 2018-12-21 DIAGNOSIS — T24201A Burn of second degree of unspecified site of right lower limb, except ankle and foot, initial encounter: Secondary | ICD-10-CM

## 2018-12-21 DIAGNOSIS — T24231A Burn of second degree of right lower leg, initial encounter: Secondary | ICD-10-CM | POA: Insufficient documentation

## 2018-12-21 DIAGNOSIS — F17210 Nicotine dependence, cigarettes, uncomplicated: Secondary | ICD-10-CM | POA: Insufficient documentation

## 2018-12-21 DIAGNOSIS — X030XXA Exposure to flames in controlled fire, not in building or structure, initial encounter: Secondary | ICD-10-CM | POA: Insufficient documentation

## 2018-12-21 DIAGNOSIS — Z23 Encounter for immunization: Secondary | ICD-10-CM | POA: Insufficient documentation

## 2018-12-21 DIAGNOSIS — Y999 Unspecified external cause status: Secondary | ICD-10-CM | POA: Insufficient documentation

## 2018-12-21 DIAGNOSIS — Y9389 Activity, other specified: Secondary | ICD-10-CM | POA: Insufficient documentation

## 2018-12-21 DIAGNOSIS — T2125XA Burn of second degree of buttock, initial encounter: Secondary | ICD-10-CM | POA: Insufficient documentation

## 2018-12-21 DIAGNOSIS — Y929 Unspecified place or not applicable: Secondary | ICD-10-CM | POA: Insufficient documentation

## 2018-12-21 HISTORY — DX: Cerebral infarction, unspecified: I63.9

## 2018-12-21 MED ORDER — BACITRACIN-NEOMYCIN-POLYMYXIN OINTMENT TUBE
TOPICAL_OINTMENT | Freq: Once | CUTANEOUS | Status: AC
  Administered 2018-12-21: via TOPICAL
  Filled 2018-12-21: qty 14.17

## 2018-12-21 MED ORDER — TETANUS-DIPHTH-ACELL PERTUSSIS 5-2.5-18.5 LF-MCG/0.5 IM SUSP
0.5000 mL | Freq: Once | INTRAMUSCULAR | Status: AC
  Administered 2018-12-21: 0.5 mL via INTRAMUSCULAR
  Filled 2018-12-21: qty 0.5

## 2018-12-21 MED ORDER — KETOROLAC TROMETHAMINE 30 MG/ML IJ SOLN
30.0000 mg | Freq: Once | INTRAMUSCULAR | Status: AC
  Administered 2018-12-21: 30 mg via INTRAMUSCULAR
  Filled 2018-12-21: qty 1

## 2018-12-21 MED ORDER — OXYCODONE-ACETAMINOPHEN 5-325 MG PO TABS: 1.0000 | ORAL_TABLET | ORAL | 0 refills | Status: DC | PRN

## 2018-12-21 MED ORDER — OXYCODONE-ACETAMINOPHEN 5-325 MG PO TABS
1.0000 | ORAL_TABLET | ORAL | Status: DC | PRN
  Administered 2018-12-21: 1 via ORAL
  Filled 2018-12-21: qty 1
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
## 2018-12-21 NOTE — ED Notes (Signed)
MD at the bedside for pt evaluation  
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
## 2018-12-30 ENCOUNTER — Encounter: Payer: Self-pay | Admitting: Emergency Medicine

## 2018-12-30 ENCOUNTER — Emergency Department
Admission: EM | Admit: 2018-12-30 | Discharge: 2018-12-30 | Disposition: A | Discharge status: Home / Self Care | Payer: Self-pay | Attending: Emergency Medicine | Admitting: Emergency Medicine

## 2018-12-30 ENCOUNTER — Other Ambulatory Visit: Payer: Self-pay

## 2018-12-30 DIAGNOSIS — F17210 Nicotine dependence, cigarettes, uncomplicated: Secondary | ICD-10-CM | POA: Insufficient documentation

## 2018-12-30 DIAGNOSIS — X58XXXD Exposure to other specified factors, subsequent encounter: Secondary | ICD-10-CM | POA: Insufficient documentation

## 2018-12-30 DIAGNOSIS — Z79899 Other long term (current) drug therapy: Secondary | ICD-10-CM | POA: Insufficient documentation

## 2018-12-30 DIAGNOSIS — T2122XD Burn of second degree of abdominal wall, subsequent encounter: Secondary | ICD-10-CM

## 2018-12-30 DIAGNOSIS — T2125XD Burn of second degree of buttock, subsequent encounter: Secondary | ICD-10-CM | POA: Insufficient documentation

## 2018-12-30 DIAGNOSIS — Z8673 Personal history of transient ischemic attack (TIA), and cerebral infarction without residual deficits: Secondary | ICD-10-CM | POA: Insufficient documentation

## 2018-12-30 DIAGNOSIS — Y929 Unspecified place or not applicable: Secondary | ICD-10-CM | POA: Insufficient documentation

## 2018-12-30 MED ORDER — SILVER SULFADIAZINE 1 % EX CREA: TOPICAL_CREAM | CUTANEOUS | 1 refills | Status: AC

## 2018-12-30 MED ORDER — SILVER SULFADIAZINE 1 % EX CREA
TOPICAL_CREAM | Freq: Once | CUTANEOUS | Status: AC
  Administered 2018-12-30: 14:00:00 via TOPICAL

## 2018-12-30 MED ORDER — LIDOCAINE 4 % EX CREA
TOPICAL_CREAM | Freq: Once | CUTANEOUS | Status: AC
  Administered 2018-12-30: 1 via TOPICAL
  Filled 2018-12-30 (×2): qty 5

## 2018-12-30 MED ORDER — LIDOCAINE-PRILOCAINE 2.5-2.5 % EX CREA: 1.0000 "application " | TOPICAL_CREAM | CUTANEOUS | 0 refills | Status: DC | PRN
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
## 2018-12-30 NOTE — ED Notes (Signed)
Silvedene/lidocaine dressing applied to buttocks
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
## 2019-05-18 ENCOUNTER — Other Ambulatory Visit: Payer: Self-pay

## 2019-05-18 ENCOUNTER — Encounter: Payer: Self-pay | Admitting: Emergency Medicine

## 2019-05-18 ENCOUNTER — Emergency Department: Payer: Self-pay

## 2019-05-18 ENCOUNTER — Emergency Department
Admission: EM | Admit: 2019-05-18 | Discharge: 2019-05-18 | Disposition: A | Discharge status: Home / Self Care | Payer: Self-pay | Attending: Emergency Medicine | Admitting: Emergency Medicine

## 2019-05-18 DIAGNOSIS — Z8673 Personal history of transient ischemic attack (TIA), and cerebral infarction without residual deficits: Secondary | ICD-10-CM | POA: Insufficient documentation

## 2019-05-18 DIAGNOSIS — R0789 Other chest pain: Secondary | ICD-10-CM

## 2019-05-18 DIAGNOSIS — F17210 Nicotine dependence, cigarettes, uncomplicated: Secondary | ICD-10-CM | POA: Insufficient documentation

## 2019-05-18 DIAGNOSIS — J069 Acute upper respiratory infection, unspecified: Secondary | ICD-10-CM | POA: Insufficient documentation

## 2019-05-18 DIAGNOSIS — F1210 Cannabis abuse, uncomplicated: Secondary | ICD-10-CM | POA: Insufficient documentation

## 2019-05-18 DIAGNOSIS — Z79899 Other long term (current) drug therapy: Secondary | ICD-10-CM | POA: Insufficient documentation

## 2019-05-18 LAB — BASIC METABOLIC PANEL
Anion gap: 9 (ref 5–15)
BUN: 10 mg/dL (ref 6–20)
CO2: 26 mmol/L (ref 22–32)
Calcium: 9.1 mg/dL (ref 8.9–10.3)
Chloride: 100 mmol/L (ref 98–111)
Creatinine, Ser: 0.84 mg/dL (ref 0.61–1.24)
GFR calc Af Amer: >60 mL/min (ref >60)
GFR calc non Af Amer: >60 mL/min (ref >60)
Glucose, Bld: 87 mg/dL (ref 70–99)
Potassium: 3.9 mmol/L (ref 3.5–5.1)
Sodium: 135 mmol/L (ref 135–145)

## 2019-05-18 LAB — CBC
HCT: 46.5 % (ref 39.0–52.0)
Hemoglobin: 15.7 g/dL (ref 13.0–17.0)
MCH: 30.7 pg (ref 26.0–34.0)
MCHC: 33.8 g/dL (ref 30.0–36.0)
MCV: 90.8 fL (ref 80.0–100.0)
Platelets: 303 10*3/uL (ref 150–400)
RBC: 5.12 MIL/uL (ref 4.22–5.81)
RDW: 14.6 % (ref 11.5–15.5)
WBC: 9.2 10*3/uL (ref 4.0–10.5)
nRBC: 0 % (ref 0.0–0.2)

## 2019-05-18 LAB — TROPONIN I (HIGH SENSITIVITY): Troponin I (High Sensitivity): 3 ng/L (ref <18)

## 2019-05-18 MED ORDER — AZITHROMYCIN 250 MG PO TABS: ORAL_TABLET | ORAL | 0 refills | Status: AC

## 2019-05-18 NOTE — ED Triage Notes (Signed)
Pt here for right sided chest pain.  Has had cough and SHOB as well.  Pain worse with breathing. Unlabored currently. Has been for couple days. No fever.  VSS at this time.

## 2019-05-18 NOTE — ED Provider Notes (Signed)
[facility] Emergency Department Provider Note   ____________________________________________    I have reviewed the triage vital signs and the nursing notes.   HISTORY  Chief Complaint Chest Pain     HPI Jimmy Thompson is a 44 y.o. male presents with complaints of chest discomfort.  Patient reports he has had a cough for approximately 3 to 4 days.  Productive of yellow and occasionally dark mucus.  Nonbloody.  No shortness of breath.  Reports the coughing causes central chest discomfort.  No significant pleurisy.  No fevers or chills.  No sick contacts.  No myalgias or Covid symptoms.  Has not take anything for this.  Patient does smoke cigarettes   Past Medical History:  Diagnosis Date  . Seizures ([place])   . Stroke [facility])     There are no problems to display for this patient.   History reviewed. No pertinent surgical history.  Prior to Admission medications   Medication Sig Start Date End Date Taking? Authorizing Provider  albuterol (PROVENTIL HFA;VENTOLIN HFA) 108 (90 Base) MCG/ACT inhaler Inhale 2 puffs into the lungs every 6 (six) hours as needed for wheezing or shortness of breath.    [provider]  azithromycin (ZITHROMAX Z-PAK) 250 MG tablet Take 2 tablets (500 mg) on  Day 1,  followed by 1 tablet (250 mg) once daily on Days 2 through 5. 05/18/19 05/23/19  Lavonia Drafts, MD  cetirizine (ZYRTEC) 10 MG tablet Take 10 mg by mouth daily.    [provider]  lidocaine-prilocaine (EMLA) cream Apply 1 application topically as needed. Mix entire content with 50 g of Silvadene and applied to the area daily 12/30/18   Sable Feil, PA-C  silver sulfADIAZINE (SILVADENE) 1 % cream Mix with topical lidocaine and applied to area daily 12/30/18 12/30/19  Sable Feil, PA-C     Allergies Patient has no known allergies.  History reviewed. No pertinent family history.  Social History Social History   Tobacco Use  . Smoking  status: Current Every Day Smoker    Packs/day: 0.50    Types: Cigarettes  . Smokeless tobacco: Current User    Types: Chew  Substance Use Topics  . Alcohol use: Yes  . Drug use: Not Currently    Types: Marijuana    Review of Systems  Constitutional: No fever/chills Eyes: No visual changes.  ENT: No sore throat. Cardiovascular: As above Respiratory: As above Gastrointestinal: No abdominal pain.  No nausea, no vomiting.   Genitourinary: Negative for dysuria. Musculoskeletal: No myalgias Skin: Negative for rash. Neurological: Negative for headaches    ____________________________________________   PHYSICAL EXAM:  VITAL SIGNS: ED Triage Vitals  Enc Vitals Group     BP 05/18/19 0858 133/76     Pulse Rate 05/18/19 0858 98     Resp 05/18/19 0858 12     Temp 05/18/19 0858 98.2 F (36.8 C)     Temp Source 05/18/19 0858 Oral     SpO2 05/18/19 0858 95 %     Weight 05/18/19 0900 99.8 kg (220 lb)     Height 05/18/19 0900 1.829 m (6')     Head Circumference --      Peak Flow --      Pain Score 05/18/19 0855 8     Pain Loc --      Pain Edu? --      Excl. in [place]? --     Constitutional: Alert and oriented. No acute distress.  Nose: No congestion/rhinnorhea. Mouth/Throat: Mucous membranes are moist.    Cardiovascular: Normal rate, regular rhythm. Grossly normal heart sounds.  Good peripheral circulation. Respiratory: Normal respiratory effort.  No retractions. Lungs CTAB. Gastrointestinal: Soft and nontender. No distention.    Musculoskeletal: No lower extremity tenderness nor edema.  Warm and well perfused Neurologic:  Normal speech and language. No gross focal neurologic deficits are appreciated.  Skin:  Skin is warm, dry and intact. No rash noted. Psychiatric: Mood and affect are normal. Speech and behavior are normal.  ____________________________________________   LABS (all labs ordered are listed, but only abnormal results are displayed)  Labs Reviewed  BASIC  METABOLIC PANEL  CBC  TROPONIN I (HIGH SENSITIVITY)   ____________________________________________  EKG  ED ECG REPORT I, Jene Every, the attending physician, personally viewed and interpreted this ECG.  Date: 05/18/2019  Rhythm: normal sinus rhythm QRS Axis: normal Intervals: normal ST/T Wave abnormalities: normal Narrative Interpretation: no evidence of acute ischemia  ____________________________________________  RADIOLOGY  Chest x-ray unremarkable, reviewed by me, no pneumothorax or pneumonia ____________________________________________   PROCEDURES  Procedure(s) performed: No  Procedures   Critical Care performed: No ____________________________________________   INITIAL IMPRESSION / ASSESSMENT AND PLAN / ED COURSE  Pertinent labs & imaging results that were available during my care of the patient were reviewed by me and considered in my medical decision making (see chart for details).  Patient well-appearing in no acute distress.  Patient reports chest discomfort started with cough.  Productive cough.  Suspicious for upper respiratory infection.  No clear pneumonia seen on x-ray.  Differential includes COVID-19 however no fever chills headaches or myalgias.  No pneumothorax.  PERC negative.  Doubt myocarditis or pericarditis  Labs quite reassuring, normal troponin, chest x-ray clear.  CBC and CMP very reassuring.  Recommend supportive care for upper respiratory infection, will trial Z-Pak    ____________________________________________   FINAL CLINICAL IMPRESSION(S) / ED DIAGNOSES  Final diagnoses:  Upper respiratory tract infection, unspecified type  Atypical chest pain        Note:  This document was prepared using Dragon voice recognition software and may include unintentional dictation errors.   Jene Every, MD 05/18/19 1341

## 2019-05-18 NOTE — ED Notes (Signed)
Pt assessment completed by MD prior to d/c  

## 2021-05-16 ENCOUNTER — Other Ambulatory Visit: Payer: Self-pay

## 2021-05-16 ENCOUNTER — Emergency Department: Payer: Self-pay

## 2021-05-16 ENCOUNTER — Emergency Department
Admission: EM | Admit: 2021-05-16 | Discharge: 2021-05-16 | Disposition: A | Discharge status: Home / Self Care | Payer: Self-pay | Attending: Emergency Medicine | Admitting: Emergency Medicine

## 2021-05-16 DIAGNOSIS — J40 Bronchitis, not specified as acute or chronic: Secondary | ICD-10-CM | POA: Insufficient documentation

## 2021-05-16 DIAGNOSIS — Z20822 Contact with and (suspected) exposure to covid-19: Secondary | ICD-10-CM | POA: Insufficient documentation

## 2021-05-16 DIAGNOSIS — F17200 Nicotine dependence, unspecified, uncomplicated: Secondary | ICD-10-CM | POA: Insufficient documentation

## 2021-05-16 LAB — RESP PANEL BY RT-PCR (FLU A&B, COVID) ARPGX2
Influenza A by PCR: NEGATIVE
Influenza B by PCR: NEGATIVE
SARS Coronavirus 2 by RT PCR: NEGATIVE

## 2021-05-16 MED ORDER — BENZONATATE 100 MG PO CAPS: ORAL_CAPSULE | ORAL | 0 refills | Status: AC

## 2021-05-16 MED ORDER — PSEUDOEPH-BROMPHEN-DM 30-2-10 MG/5ML PO SYRP: 5.0000 mL | ORAL_SOLUTION | Freq: Four times a day (QID) | ORAL | 0 refills | Status: AC | PRN

## 2021-05-16 MED ORDER — FLUTICASONE PROPIONATE 50 MCG/ACT NA SUSP: 1.0000 | Freq: Every day | NASAL | 0 refills | Status: AC

## 2021-05-16 MED ORDER — PREDNISONE 20 MG PO TABS: 40.0000 mg | ORAL_TABLET | Freq: Every day | ORAL | 0 refills | Status: AC

## 2021-05-16 MED ORDER — AZITHROMYCIN 250 MG PO TABS: ORAL_TABLET | ORAL | 0 refills | Status: DC

## 2021-05-16 NOTE — Discharge Instructions (Addendum)
Take the prescription meds as directed. Follow-up with one of the community clinics as needed.  ?

## 2021-05-16 NOTE — ED Triage Notes (Signed)
Pt to ED POV for cough since 4-5 days with yellow and green sputum, smokes 1 pack cigarettes q 3 days, since 20 years.  ? ?States chest feels congested and has nasal drainage. Denies sore throat, breathing is unlabored.  ? ?

## 2021-05-16 NOTE — ED Provider Notes (Signed)
? ? ?[facility] ?Emergency Department Provider Note ? ? ? ? Event Date/Time  ? First MD Initiated Contact with Patient 05/16/21 1534   ?  (approximate) ? ? ?History  ? ?Cough ? ? ?HPI ? ?Jimmy Thompson is a 46 y.o. male with a history of seizures, stroke, and tobacco use, presents to the ED with a 4 to 5-day complaint of productive cough.  Patient also reports some nasal drainage and congestion.  He denies any fevers, chills, sweats, nausea, vomiting, or diarrhea.  Patient is a current everyday smoker.  ? ? ? ?Physical Exam  ? ?Triage Vital Signs: ?ED Triage Vitals  ?Enc Vitals Group  ?   BP 05/16/21 1442 (!) 136/92  ?   Pulse Rate 05/16/21 1442 (!) 101  ?   Resp 05/16/21 1442 20  ?   Temp 05/16/21 1442 98.5 ?F (36.9 ?C)  ?   Temp Source 05/16/21 1442 Oral  ?   SpO2 05/16/21 1442 93 %  ?   Weight 05/16/21 1444 245 lb (111.1 kg)  ?   Height 05/16/21 1444 6' (1.829 m)  ?   Head Circumference --   ?   Peak Flow --   ?   Pain Score 05/16/21 1443 0  ?   Pain Loc --   ?   Pain Edu? --   ?   Excl. in [place]? --   ? ? ?Most recent vital signs: ?Vitals:  ? 05/16/21 1442  ?BP: (!) 136/92  ?Pulse: (!) 101  ?Resp: 20  ?Temp: 98.5 ?F (36.9 ?C)  ?SpO2: 93%  ? ? ?General Awake, no distress.  ?HEENT NCAT. PERRL. EOMI. No rhinorrhea. Mucous membranes are moist. ?CV:  Good peripheral perfusion.  ?RESP:  Normal effort.  ?ABD:  No distention.  ? ? ?ED Results / Procedures / Treatments  ? ?Labs ?(all labs ordered are listed, but only abnormal results are displayed) ?Labs Reviewed  ?RESP PANEL BY RT-PCR (FLU A&B, COVID) ARPGX2  ? ? ? ?EKG ? ? ?RADIOLOGY ? ?I personally viewed and evaluated these images as part of my medical decision making, as well as reviewing the written report by the radiologist. ? ?ED Provider Interpretation: no acute findings} ? ?DG Chest 2 View ? ?Result Date: 05/16/2021 ?CLINICAL DATA:  Cough EXAM: CHEST - 2 VIEW COMPARISON:  Prior chest x-ray 05/18/2018 FINDINGS: The lungs are clear and  negative for focal airspace consolidation, pulmonary edema or suspicious pulmonary nodule. No pleural effusion or pneumothorax. Cardiac and mediastinal contours are within normal limits. No acute fracture or lytic or blastic osseous lesions. The visualized upper abdominal bowel gas pattern is unremarkable. IMPRESSION: Negative chest x-ray. Electronically Signed   By: Malachy Moan M.D.   On: 05/16/2021 15:06   ? ? ?PROCEDURES: ? ?Critical Care performed: No ? ?Procedures ? ? ?MEDICATIONS ORDERED IN ED: ?Medications - No data to display ? ? ?IMPRESSION / MDM / ASSESSMENT AND PLAN / ED COURSE  ?I reviewed the triage vital signs and the nursing notes. ?             ?               ? ?Differential diagnosis includes, but is not limited to, viral URI, bronchitis, influenza, Covid, CAP ? ?Patient to the ED for evaluation of persistent cough for the last 2 to 3 days.  Patient presents in no acute distress noting productive cough without associated fevers, chest pain, shortness of breath.  He  is evaluated for his complaints in the ED, but I have reassuring work-up without signs of respiratory distress.  No x-ray evidence of any acute intrathoracic process or consolidation based on my review of images.  Viral panel screen is negative at this time.  Patient's diagnosis is consistent with bronchitis. Patient will be discharged home with prescriptions for azithromycin, prednisone, Tessalon Perles, Bromfed syrup. Patient is to follow up with local urgent care community clinic as needed or otherwise directed. Patient is given ED precautions to return to the ED for any worsening or new symptoms. ? ? ?FINAL CLINICAL IMPRESSION(S) / ED DIAGNOSES  ? ?Final diagnoses:  ?Bronchitis  ? ? ? ?Rx / DC Orders  ? ?ED Discharge Orders   ? ?      Ordered  ?  azithromycin (ZITHROMAX Z-PAK) 250 MG tablet       ? 05/16/21 1603  ?  benzonatate (TESSALON PERLES) 100 MG capsule       ? 05/16/21 1603  ?  brompheniramine-pseudoephedrine-DM 30-2-10  MG/5ML syrup  4 times daily PRN       ? 05/16/21 1603  ?  predniSONE (DELTASONE) 20 MG tablet  Daily with breakfast       ? 05/16/21 1605  ?  fluticasone (FLONASE) 50 MCG/ACT nasal spray  Daily       ? 05/16/21 1605  ? ?  ?  ? ?  ? ? ? ?Note:  This document was prepared using Dragon voice recognition software and may include unintentional dictation errors. ? ?  ?Lissa Hoard, PA-C ?05/16/21 1904 ? ?  ?Minna Antis, MD ?05/16/21 2233 ? ?

## 2022-04-17 ENCOUNTER — Emergency Department
Admission: EM | Admit: 2022-04-17 | Discharge: 2022-04-17 | Disposition: A | Discharge status: Home / Self Care | Payer: Self-pay | Attending: Student in an Organized Health Care Education/Training Program | Admitting: Student in an Organized Health Care Education/Training Program

## 2022-04-17 ENCOUNTER — Other Ambulatory Visit: Payer: Self-pay

## 2022-04-17 ENCOUNTER — Emergency Department: Payer: Self-pay

## 2022-04-17 DIAGNOSIS — R519 Headache, unspecified: Secondary | ICD-10-CM | POA: Insufficient documentation

## 2022-04-17 DIAGNOSIS — R1031 Right lower quadrant pain: Secondary | ICD-10-CM | POA: Insufficient documentation

## 2022-04-17 LAB — COMPREHENSIVE METABOLIC PANEL
ALT: 17 U/L (ref 0–44)
AST: 22 U/L (ref 15–41)
Albumin: 4.5 g/dL (ref 3.5–5.0)
Alkaline Phosphatase: 58 U/L (ref 38–126)
Anion gap: 13 (ref 5–15)
BUN: 9 mg/dL (ref 6–20)
CO2: 24 mmol/L (ref 22–32)
Calcium: 9.7 mg/dL (ref 8.9–10.3)
Chloride: 99 mmol/L (ref 98–111)
Creatinine, Ser: 0.74 mg/dL (ref 0.61–1.24)
GFR, Estimated: >60 mL/min (ref >60)
Glucose, Bld: 104 mg/dL — ABNORMAL HIGH (ref 70–99)
Potassium: 4 mmol/L (ref 3.5–5.1)
Sodium: 136 mmol/L (ref 135–145)
Total Bilirubin: 1 mg/dL (ref 0.3–1.2)
Total Protein: 8.4 g/dL — ABNORMAL HIGH (ref 6.5–8.1)

## 2022-04-17 LAB — URINALYSIS, ROUTINE W REFLEX MICROSCOPIC
Bacteria, UA: NONE SEEN
Bilirubin Urine: NEGATIVE
Glucose, UA: NEGATIVE mg/dL
Hgb urine dipstick: NEGATIVE
Ketones, ur: 5 mg/dL — AB
Nitrite: NEGATIVE
Protein, ur: NEGATIVE mg/dL
Specific Gravity, Urine: 1.019 (ref 1.005–1.030)
Squamous Epithelial / HPF: NONE SEEN /HPF (ref 0–5)
pH: 6 (ref 5.0–8.0)

## 2022-04-17 LAB — CBC
HCT: 48 % (ref 39.0–52.0)
Hemoglobin: 16.3 g/dL (ref 13.0–17.0)
MCH: 30.6 pg (ref 26.0–34.0)
MCHC: 34 g/dL (ref 30.0–36.0)
MCV: 90.1 fL (ref 80.0–100.0)
Platelets: 330 10*3/uL (ref 150–400)
RBC: 5.33 MIL/uL (ref 4.22–5.81)
RDW: 14 % (ref 11.5–15.5)
WBC: 9 10*3/uL (ref 4.0–10.5)
nRBC: 0 % (ref 0.0–0.2)

## 2022-04-17 LAB — LIPASE, BLOOD: Lipase: 27 U/L (ref 11–51)

## 2022-04-17 MED ORDER — ONDANSETRON HCL 4 MG/2ML IJ SOLN
4.0000 mg | Freq: Once | INTRAMUSCULAR | Status: AC
  Administered 2022-04-17: 4 mg via INTRAVENOUS
  Filled 2022-04-17: qty 2

## 2022-04-17 MED ORDER — CYCLOBENZAPRINE HCL 10 MG PO TABS: 10.0000 mg | ORAL_TABLET | Freq: Three times a day (TID) | ORAL | 0 refills | Status: DC | PRN

## 2022-04-17 MED ORDER — IOHEXOL 300 MG/ML  SOLN
100.0000 mL | Freq: Once | INTRAMUSCULAR | Status: AC | PRN
  Administered 2022-04-17: 100 mL via INTRAVENOUS

## 2022-04-17 MED ORDER — MORPHINE SULFATE (PF) 4 MG/ML IV SOLN
4.0000 mg | INTRAVENOUS | Status: DC | PRN
  Administered 2022-04-17: 4 mg via INTRAVENOUS
  Filled 2022-04-17: qty 1

## 2022-04-17 NOTE — ED Notes (Signed)
Pt states that he has been having rlq pain frequently for quite awhile and is getting worse, states that the pain also is in his right flank, states hx of kidney stones in the past, reports last bm was this am, states some pain with urination

## 2022-04-17 NOTE — Discharge Instructions (Signed)

## 2022-04-17 NOTE — ED Provider Notes (Signed)
[facility] Provider Note    Event Date/Time   First MD Initiated Contact with Patient 04/17/22 1026     (approximate)   History   Abdominal Pain and Headache   HPI  Jimmy Thompson is a 47 y.o. male presents to the ER for evaluation of several days of progressively worsening right lower quadrant pain radiating to right flank.  Does have some discomfort with urination.  Has had some chills no measured fever.  Does have some nausea.  Does have headache from time to time.  States he did bump his head caring for his elderly father who falls a lot.  No LOC.  Not on any blood thinners.  Neuro intact.  No heavy mechanism for injury.     Physical Exam   Triage Vital Signs: ED Triage Vitals  Enc Vitals Group     BP 04/17/22 1015 (!) 138/104     Pulse Rate 04/17/22 1015 92     Resp 04/17/22 1015 16     Temp 04/17/22 1015 97.6 F (36.4 C)     Temp src --      SpO2 04/17/22 1015 96 %     Weight 04/17/22 1016 245 lb (111.1 kg)     Height 04/17/22 1016 6' (1.829 m)     Head Circumference --      Peak Flow --      Pain Score 04/17/22 1016 10     Pain Loc --      Pain Edu? --      Excl. in [place]? --     Most recent vital signs: Vitals:   04/17/22 1015  BP: (!) 138/104  Pulse: 92  Resp: 16  Temp: 97.6 F (36.4 C)  SpO2: 96%     Constitutional: Alert  Eyes: Conjunctivae are normal.  Head: Atraumatic. Nose: No congestion/rhinnorhea. Mouth/Throat: Mucous membranes are moist.   Neck: Painless ROM.  Cardiovascular:   Good peripheral circulation. Respiratory: Normal respiratory effort.  No retractions.  Gastrointestinal: Soft mild tenderness to palpation in the right lower quadrant. Musculoskeletal:  no deformity Neurologic:  MAE spontaneously. No gross focal neurologic deficits are appreciated.  Skin:  Skin is warm, dry and intact. No rash noted. Psychiatric: Mood and affect are normal. Speech and behavior are normal.    ED Results / Procedures /  Treatments   Labs (all labs ordered are listed, but only abnormal results are displayed) Labs Reviewed  COMPREHENSIVE METABOLIC PANEL - Abnormal; Notable for the following components:      Result Value   Glucose, Bld 104 (*)    Total Protein 8.4 (*)    All other components within normal limits  URINALYSIS, ROUTINE W REFLEX MICROSCOPIC - Abnormal; Notable for the following components:   Color, Urine AMBER (*)    APPearance HAZY (*)    Ketones, ur 5 (*)    Leukocytes,Ua TRACE (*)    All other components within normal limits  LIPASE, BLOOD  CBC     EKG     RADIOLOGY Please see ED Course for my review and interpretation.  I personally reviewed all radiographic images ordered to evaluate for the above acute complaints and reviewed radiology reports and findings.  These findings were personally discussed with the patient.  Please see medical record for radiology report.    PROCEDURES:  Critical Care performed: No  Procedures   MEDICATIONS ORDERED IN ED: Medications  morphine (PF) 4 MG/ML injection 4 mg (4 mg Intravenous Given  04/17/22 1106)  ondansetron (ZOFRAN) injection 4 mg (4 mg Intravenous Given 04/17/22 1106)  iohexol (OMNIPAQUE) 300 MG/ML solution 100 mL (100 mLs Intravenous Contrast Given 04/17/22 1132)     IMPRESSION / MDM / ASSESSMENT AND PLAN / ED COURSE  I reviewed the triage vital signs and the nursing notes.                              Differential diagnosis includes, but is not limited to, cystitis, colitis, diverticulitis, hernia, stone, pyelonephritis, cystitis, MSK strainp  Patient presenting to the ER for evaluation of symptoms as described above.  Based on symptoms, risk factors and considered above differential, this presenting complaint could reflect a potentially life-threatening illness therefore the patient will be placed on continuous pulse oximetry and telemetry for monitoring.  Laboratory evaluation will be sent to evaluate for the above  complaints.  Will order IV morphine for pain.  I am concerned for possible appendicitis given his pain and discomfort will order CT imaging the abdomen pelvis.  Medication for neuroimaging based on reassuring exam.  Not consistent with [facility] or traumatic injury.  His primary complaint today is right lower quadrant abdominal discomfort.   Clinical Course as of 04/17/22 1206  Tue Apr 17, 2022  1155 CT imaging on my review and patient shows noninflamed appearing appendix no clear kidney stone.  No sign of hernia.  Will await formal radiology report.  No leukocytosis.  Blood work otherwise reassuring.  No sign of UTI. [PR]  1206 Reassessed.  Abdominal exam remains benign.  Given reassuring imaging and workup question whether he may have strained a muscle caring for his father at home.  Despite release stable and appropriate for outpatient follow-up.  We discussed strict return precautions.  Patient agreeable plan. [PR]    Clinical Course User Index [PR] Merlyn Lot, MD     FINAL CLINICAL IMPRESSION(S) / ED DIAGNOSES   Final diagnoses:  Right lower quadrant abdominal pain     Rx / DC Orders   ED Discharge Orders          Ordered    cyclobenzaprine (FLEXERIL) 10 MG tablet  3 times daily PRN        04/17/22 1205             Note:  This document was prepared using Dragon voice recognition software and may include unintentional dictation errors.    Merlyn Lot, MD 04/17/22 [phone]

## 2022-04-17 NOTE — ED Triage Notes (Signed)
Pt to ED for intermittent headaches and generalized abd pain radiating to lower back for the past week. +nausea. Ambulatory to treatment room, NAD noted.

## 2022-09-27 ENCOUNTER — Other Ambulatory Visit: Payer: Self-pay

## 2022-09-27 ENCOUNTER — Encounter: Payer: Self-pay | Admitting: Emergency Medicine

## 2022-09-27 ENCOUNTER — Emergency Department
Admission: EM | Admit: 2022-09-27 | Discharge: 2022-09-27 | Disposition: A | Discharge status: Home / Self Care | Payer: Self-pay | Attending: Emergency Medicine | Admitting: Emergency Medicine

## 2022-09-27 ENCOUNTER — Emergency Department: Payer: Self-pay

## 2022-09-27 DIAGNOSIS — M5412 Radiculopathy, cervical region: Secondary | ICD-10-CM | POA: Insufficient documentation

## 2022-09-27 DIAGNOSIS — H538 Other visual disturbances: Secondary | ICD-10-CM | POA: Insufficient documentation

## 2022-09-27 DIAGNOSIS — R519 Headache, unspecified: Secondary | ICD-10-CM | POA: Insufficient documentation

## 2022-09-27 LAB — BASIC METABOLIC PANEL
Anion gap: 11 (ref 5–15)
BUN: 11 mg/dL (ref 6–20)
CO2: 24 mmol/L (ref 22–32)
Calcium: 9.4 mg/dL (ref 8.9–10.3)
Chloride: 98 mmol/L (ref 98–111)
Creatinine, Ser: 0.85 mg/dL (ref 0.61–1.24)
GFR, Estimated: >60 mL/min (ref >60)
Glucose, Bld: 133 mg/dL — ABNORMAL HIGH (ref 70–99)
Potassium: 4 mmol/L (ref 3.5–5.1)
Sodium: 133 mmol/L — ABNORMAL LOW (ref 135–145)

## 2022-09-27 LAB — CBC WITH DIFFERENTIAL/PLATELET
Abs Immature Granulocytes: 0.04 10*3/uL (ref 0.00–0.07)
Basophils Absolute: 0.1 10*3/uL (ref 0.0–0.1)
Basophils Relative: 1 %
Eosinophils Absolute: 0.2 10*3/uL (ref 0.0–0.5)
Eosinophils Relative: 2 %
HCT: 45.9 % (ref 39.0–52.0)
Hemoglobin: 16.1 g/dL (ref 13.0–17.0)
Immature Granulocytes: 0 %
Lymphocytes Relative: 17 %
Lymphs Abs: 1.8 10*3/uL (ref 0.7–4.0)
MCH: 31.1 pg (ref 26.0–34.0)
MCHC: 35.1 g/dL (ref 30.0–36.0)
MCV: 88.8 fL (ref 80.0–100.0)
Monocytes Absolute: 0.9 10*3/uL (ref 0.1–1.0)
Monocytes Relative: 9 %
Neutro Abs: 7.6 10*3/uL (ref 1.7–7.7)
Neutrophils Relative %: 71 %
Platelets: 367 10*3/uL (ref 150–400)
RBC: 5.17 MIL/uL (ref 4.22–5.81)
RDW: 14 % (ref 11.5–15.5)
WBC: 10.7 10*3/uL — ABNORMAL HIGH (ref 4.0–10.5)
nRBC: 0 % (ref 0.0–0.2)

## 2022-09-27 MED ORDER — HYDROCODONE-ACETAMINOPHEN 5-325 MG PO TABS
1.0000 | ORAL_TABLET | ORAL | 0 refills | Status: DC | PRN
Start: 2022-09-27 — End: 2023-06-18

## 2022-09-27 MED ORDER — PREDNISONE 10 MG PO TABS: 10.0000 mg | ORAL_TABLET | Freq: Every day | ORAL | 0 refills | Status: DC

## 2022-09-27 NOTE — ED Triage Notes (Signed)
Pt reports headaches and blurred vision for a few weeks. Bilateral shoulder pain for 2 days, denies CP or SOB.

## 2022-09-27 NOTE — ED Provider Notes (Signed)
[facility] Provider Note    Event Date/Time   First MD Initiated Contact with Patient 09/27/22 1218     (approximate)  History   Chief Complaint: Shoulder Pain, Blurred Vision, and Headache  HPI  Jimmy Thompson is a 47 y.o. male with a past medical history of seizure disorder presents to the emergency department for left neck and arm pain as well as intermittent blurred vision.  According to the patient for the past 2 to 3 weeks he has been experiencing pain in his left neck radiating down into his left shoulder and sometimes shooting down the left arm.  States the pain is worse if he extends his neck back.  Patient states he has been using ice packs to the area with some relief.  Patient also states for the last few months he has intermittently been experiencing blurred vision.  He states he normally notices this when at work especially if stressed denies any current visual symptoms.  Denies any weakness or numbness of any arm or leg.  Physical Exam   Triage Vital Signs: ED Triage Vitals  Encounter Vitals Group     BP 09/27/22 1141 120/85     Systolic BP Percentile --      Diastolic BP Percentile --      Pulse Rate 09/27/22 1141 (!) 111     Resp 09/27/22 1141 18     Temp 09/27/22 1141 97.6 F (36.4 C)     Temp Source 09/27/22 1141 Oral     SpO2 09/27/22 1141 96 %     Weight 09/27/22 1142 240 lb (108.9 kg)     Height 09/27/22 1142 6' (1.829 m)     Head Circumference --      Peak Flow --      Pain Score 09/27/22 1142 10     Pain Loc --      Pain Education --      Exclude from Growth Chart --     Most recent vital signs: Vitals:   09/27/22 1141  BP: 120/85  Pulse: (!) 111  Resp: 18  Temp: 97.6 F (36.4 C)  SpO2: 96%    General: Awake, no distress.  CV:  Good peripheral perfusion.  Regular rate and rhythm  Resp:  Normal effort.  Equal breath sounds bilaterally.  Abd:  No distention.  Soft, nontender.  No rebound or guarding. Other:  Mild  tenderness to palpation of the left cervical paraspinal muscles into the left trapezius.   ED Results / Procedures / Treatments   EKG  EKG viewed and interpreted by myself shows a sinus tachycardia 118 bpm with a narrow QRS, normal axis, normal intervals, nonspecific ST changes.  RADIOLOGY  I have reviewed and interpreted the shoulder x-ray images.  No obvious fracture or dislocation on my evaluation.    MEDICATIONS ORDERED IN ED: Medications - No data to display   IMPRESSION / MDM / ASSESSMENT AND PLAN / ED COURSE  I reviewed the triage vital signs and the nursing notes.  Patient's presentation is most consistent with acute presentation with potential threat to life or bodily function.  Patient presents the emergency department for left neck/left arm pain.  Patient CBC is reassuring, chemistry is reassuring.  Denies any chest pain.  EKG is reassuring.  Patient's description of the pain and physical exam is most consistent with a cervical radiculopathy.  Patient describes pain in the left part of his neck shooting down into the left shoulder  especially if he extends his neck backwards or at times if he twists or turns his neck.  Equal grip strength bilaterally no pronator drift.  Will place patient on a prednisone taper as well as a short course of pain medication.  Given the patient's complaint of intermittent blurred vision he states this only typically happens when he is stressed or upset at work.  No cranial nerve deficits.  Discussed with the patient need to follow-up with a PCP.  We will place a referral for PCP for the patient as he does not currently have a physician.  Patient agreeable to plan of care.  Discussed return precautions.  FINAL CLINICAL IMPRESSION(S) / ED DIAGNOSES   Cervical radiculopathy Blurred vision   Note:  This document was prepared using Dragon voice recognition software and may include unintentional dictation errors.   Minna Antis, MD 09/27/22  1255

## 2022-10-01 NOTE — Group Note (Deleted)

## 2023-06-18 ENCOUNTER — Other Ambulatory Visit: Payer: Self-pay

## 2023-06-18 ENCOUNTER — Emergency Department: Payer: Self-pay

## 2023-06-18 ENCOUNTER — Emergency Department
Admission: EM | Admit: 2023-06-18 | Discharge: 2023-06-18 | Disposition: A | Discharge status: Home / Self Care | Payer: Self-pay | Attending: Emergency Medicine | Admitting: Emergency Medicine

## 2023-06-18 DIAGNOSIS — M25511 Pain in right shoulder: Secondary | ICD-10-CM | POA: Insufficient documentation

## 2023-06-18 DIAGNOSIS — M25512 Pain in left shoulder: Secondary | ICD-10-CM | POA: Insufficient documentation

## 2023-06-18 DIAGNOSIS — M542 Cervicalgia: Secondary | ICD-10-CM | POA: Insufficient documentation

## 2023-06-18 LAB — COMPREHENSIVE METABOLIC PANEL WITH GFR
ALT: 14 U/L (ref 0–44)
AST: 16 U/L (ref 15–41)
Albumin: 4.4 g/dL (ref 3.5–5.0)
Alkaline Phosphatase: 53 U/L (ref 38–126)
Anion gap: 9 (ref 5–15)
BUN: 7 mg/dL (ref 6–20)
CO2: 24 mmol/L (ref 22–32)
Calcium: 9 mg/dL (ref 8.9–10.3)
Chloride: 104 mmol/L (ref 98–111)
Creatinine, Ser: 0.73 mg/dL (ref 0.61–1.24)
GFR, Estimated: >60 mL/min (ref >60)
Glucose, Bld: 94 mg/dL (ref 70–99)
Potassium: 3.9 mmol/L (ref 3.5–5.1)
Sodium: 137 mmol/L (ref 135–145)
Total Bilirubin: 0.7 mg/dL (ref 0.0–1.2)
Total Protein: 7.7 g/dL (ref 6.5–8.1)

## 2023-06-18 LAB — CBC WITH DIFFERENTIAL/PLATELET
Abs Immature Granulocytes: 0.04 10*3/uL (ref 0.00–0.07)
Basophils Absolute: 0.1 10*3/uL (ref 0.0–0.1)
Basophils Relative: 1 %
Eosinophils Absolute: 0.1 10*3/uL (ref 0.0–0.5)
Eosinophils Relative: 2 %
HCT: 49.4 % (ref 39.0–52.0)
Hemoglobin: 16.5 g/dL (ref 13.0–17.0)
Immature Granulocytes: 0 %
Lymphocytes Relative: 24 %
Lymphs Abs: 2.1 10*3/uL (ref 0.7–4.0)
MCH: 31 pg (ref 26.0–34.0)
MCHC: 33.4 g/dL (ref 30.0–36.0)
MCV: 92.7 fL (ref 80.0–100.0)
Monocytes Absolute: 0.7 10*3/uL (ref 0.1–1.0)
Monocytes Relative: 8 %
Neutro Abs: 5.8 10*3/uL (ref 1.7–7.7)
Neutrophils Relative %: 65 %
Platelets: 322 10*3/uL (ref 150–400)
RBC: 5.33 MIL/uL (ref 4.22–5.81)
RDW: 14.2 % (ref 11.5–15.5)
WBC: 9 10*3/uL (ref 4.0–10.5)
nRBC: 0 % (ref 0.0–0.2)

## 2023-06-18 LAB — LIPASE, BLOOD: Lipase: 30 U/L (ref 11–51)

## 2023-06-18 LAB — TROPONIN I (HIGH SENSITIVITY): Troponin I (High Sensitivity): 3 ng/L (ref <18)

## 2023-06-18 MED ORDER — ACETAMINOPHEN 500 MG PO TABS
1000.0000 mg | ORAL_TABLET | Freq: Once | ORAL | Status: AC
  Administered 2023-06-18: 1000 mg via ORAL
  Filled 2023-06-18: qty 2

## 2023-06-18 MED ORDER — KETOROLAC TROMETHAMINE 30 MG/ML IJ SOLN: 30.0000 mg | Freq: Once | INTRAMUSCULAR | Status: DC

## 2023-06-18 MED ORDER — IOHEXOL 350 MG/ML SOLN
75.0000 mL | Freq: Once | INTRAVENOUS | Status: AC | PRN
  Administered 2023-06-18: 75 mL via INTRAVENOUS

## 2023-06-18 MED ORDER — TETRACAINE HCL 0.5 % OP SOLN
1.0000 [drp] | Freq: Once | OPHTHALMIC | Status: AC
  Administered 2023-06-18: 1 [drp] via OPHTHALMIC
  Filled 2023-06-18: qty 4

## 2023-06-18 MED ORDER — LIDOCAINE 5 % EX PTCH
1.0000 | MEDICATED_PATCH | CUTANEOUS | Status: DC
  Administered 2023-06-18: 1 via TRANSDERMAL
  Filled 2023-06-18: qty 1

## 2023-06-18 MED ORDER — METHOCARBAMOL 500 MG PO TABS
500.0000 mg | ORAL_TABLET | Freq: Once | ORAL | Status: AC
  Administered 2023-06-18: 500 mg via ORAL
  Filled 2023-06-18: qty 1

## 2023-06-18 MED ORDER — KETOROLAC TROMETHAMINE 30 MG/ML IJ SOLN
30.0000 mg | Freq: Once | INTRAMUSCULAR | Status: AC
  Administered 2023-06-18: 30 mg via INTRAVENOUS
  Filled 2023-06-18: qty 1

## 2023-06-18 MED ORDER — FLUORESCEIN SODIUM 1 MG OP STRP
1.0000 | ORAL_STRIP | Freq: Once | OPHTHALMIC | Status: AC
  Administered 2023-06-18: 1 via OPHTHALMIC
  Filled 2023-06-18: qty 1

## 2023-06-18 MED ORDER — GADOBUTROL 1 MMOL/ML IV SOLN
6.0000 mL | Freq: Once | INTRAVENOUS | Status: AC | PRN
  Administered 2023-06-18: 6 mL via INTRAVENOUS

## 2023-06-18 MED ORDER — SODIUM CHLORIDE 0.9 % IV BOLUS
1000.0000 mL | Freq: Once | INTRAVENOUS | Status: AC
  Administered 2023-06-18: 1000 mL via INTRAVENOUS

## 2023-06-18 NOTE — ED Notes (Addendum)
 CIWA-Alcohol Scale not completed at ordered time by previous [employer].

## 2023-06-18 NOTE — ED Provider Notes (Signed)
-----------------------------------------   3:38 PM on 06/18/2023 -----------------------------------------  Blood pressure (!) 143/88, pulse 88, temperature 98 F (36.7 C), resp. rate 18, height 6' (1.829 m), weight 63.5 kg, SpO2 100%.  Assuming care from [place], [place].  In short, Jimmy Thompson is a 48 y.o. male with a chief complaint of Neck Pain and Back Pain .  Refer to the original H&P for additional details.  The current plan of care is to await imaging. If negative, will consider MRI of brain and cervical following reassessment.  Clinical Course as of 06/18/23 2219  Tue Jun 18, 2023  1525 Comprehensive metabolic panel unremarkable [LD]  1530 CBC with Differential unremarkable [LD]  1530 Lipase, blood unremarkable [LD]  1656 Patient reporting blurriness has improved some by stating " it is not as blurry as it was before" since returning from CT scan.  Requesting pain medication as his pain is more in his neck and bilateral shoulder.  Will administer Toradol .  [MH]  1728 Normal fluorescein eye exam - presentation consistent with subconjunctival hemorrhage, no pain with EOM.  Unknown trauma [MH]  1729 Left eye IOP 9 mmHg ; Right eye IOP 7 mmHg with tono-pen  [MH]  1737 Visual acuity reassuring. [MH]  1738 Patient has some sensation in the right upper extremity.  States absent sensation in the left upper extremity all throughout.  [MH]  1739 DG Chest 2 View Normal  [MH]  1740 CT Head Wo Contrast Normal  [MH]  1740 CT Thoracic Spine Wo Contrast Normal  [MH]  1741 CT C-SPINE NO CHARGE Disc levels: Disc space narrowing at C6-7. Early spurring C4-5 through C6-7. Otherwise normal [MH]  1920 CT ANGIO HEAD NECK W WO CM Normal ---> MRI Brain and cervical ordered [MH]  2120 MR BRAIN W WO CONTRAST Normal [MH]  2200 Reassessed patient reports feeling " so-so" he states some sensation in left UE which is improvement from initial assessment. [MH]  2209 MR Cervical Spine W and Wo  Contrast IMPRESSION: 1. 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 2. No spinal canal stenosis.  [MH]    Clinical Course User Index [LD] Phyliss Breen, PA-C [MH] Phyllis Breeze, Jodeci Roarty A, PA-C   ----------------------------------------- 10:20 PM on 06/18/2023 -----------------------------------------  MRI of cervical spine reveals moderate bilateral C6-7 neural foraminal stenosis secondary to uncovertebral hypertrophy.  This is updated to the patient.  Will have him follow-up with neurosurgery.  He is in stable condition for discharge at this time with outpatient management.   Phyllis Breeze, Laini Urick A, PA-C 06/18/23 2221    Arline Bennett, MD 06/18/23 2226

## 2023-06-18 NOTE — ED Provider Notes (Signed)
 [facility] Provider Note    Event Date/Time   First MD Initiated Contact with Patient 06/18/23 1324     (approximate)   History   Neck Pain and Back Pain   HPI  Jimmy Thompson is a 48 y.o. male with PMH of seizures and stroke presents for evaluation of multiple complaints.  Patient states that he has had upper back pain and neck pain for about a month.  He feels like he did something at work and has an acute worsening of his symptoms.  He reports pain that radiates down both arms and states that he has numbness in both arms.  He states that he has had tremors in his arms for a week.  Patient also endorses left eye pain for one week.  He denies any specific injury or foreign body sensation.  He does endorse intermittent blurry vision in both eyes for a few days. He also states he is feeling short of breath and has been for a week.      Physical Exam   Triage Vital Signs: ED Triage Vitals [06/18/23 1301]  Encounter Vitals Group     BP (!) 143/88     Systolic BP Percentile      Diastolic BP Percentile      Pulse Rate 88     Resp 18     Temp 98 F (36.7 C)     Temp src      SpO2 100 %     Weight 140 lb (63.5 kg)     Height 6' (1.829 m)     Head Circumference      Peak Flow      Pain Score 10     Pain Loc      Pain Education      Exclude from Growth Chart     Most recent vital signs: Vitals:   06/18/23 1301  BP: (!) 143/88  Pulse: 88  Resp: 18  Temp: 98 F (36.7 C)  SpO2: 100%   General: Awake, no distress.  CV:  Good peripheral perfusion.  RRR. Resp:  Normal effort.  CTAB. Abd:  No distention.  Left eye: Sclera is injected, PERRL, EOM intact Other:  Tender to palpation throughout the cervical and thoracic spine. Spine ROM produces pain. Strength is equal in bilateral upper extremities. Radial pulses are 2+ and regular. Patient states he cannot feel anything in bilateral arms on dermatomal exam.    ED Results / Procedures /  Treatments   Labs (all labs ordered are listed, but only abnormal results are displayed) Labs Reviewed  COMPREHENSIVE METABOLIC PANEL WITH GFR  CBC WITH DIFFERENTIAL/PLATELET  LIPASE, BLOOD  CBG MONITORING, ED  TROPONIN I (HIGH SENSITIVITY)     EKG  Will obtain EKG.   RADIOLOGY  Will obtain chest xray, CT head, CT cervical spine, CT thoracic spine and CTA head and neck.    PROCEDURES:  Critical Care performed: No  Procedures   MEDICATIONS ORDERED IN ED: Medications  fluorescein  ophthalmic strip 1 strip (1 strip Both Eyes Given 06/18/23 1443)  tetracaine  (PONTOCAINE) 0.5 % ophthalmic solution 1 drop (1 drop Left Eye Given 06/18/23 1443)  sodium chloride  0.9 % bolus 1,000 mL (1,000 mLs Intravenous New Bag/Given 06/18/23 1443)     IMPRESSION / MDM / ASSESSMENT AND PLAN / ED COURSE  I reviewed the triage vital signs and the nursing notes.  48 year old male presents for evaluation of multiple complaints. BP is elevated, VSS otherwise. Patient is NAD on exam.  Differential diagnosis includes, but is not limited to, TIA, conversion disorder, cervical radiculopathy, muscle strain, subconjunctival hemorrhage, corneal abrasion, pneumonia, pleurisy, pneumothorax, URI.   Patient's presentation is most consistent with acute presentation with potential threat to life or bodily function.  Given patient's multiple complaints will obtain a comprehensive work up.   Will obtain basic labs, EKG, chest xray, CT head, CT cervical spine, CT thoracic spine and CTA head and neck. Will also do a fluorescein dye exam and evaluate visual acuity and eye pressures.   Care of the patient will be passed to the oncoming provider pending imaging studies and ocular exam.   Clinical Course as of 06/18/23 1531  Tue Jun 18, 2023  1525 Comprehensive metabolic panel unremarkable [LD]  1530 CBC with Differential unremarkable [LD]  1530 Lipase, blood unremarkable [LD]     Clinical Course User Index [LD] Phyliss Breen, PA-C     FINAL CLINICAL IMPRESSION(S) / ED DIAGNOSES   Final diagnoses:  None     Rx / DC Orders   ED Discharge Orders     None        Note:  This document was prepared using Dragon voice recognition software and may include unintentional dictation errors.   Phyliss Breen, PA-C 06/18/23 1532    Kandee Orion, MD 06/19/23 [phone]

## 2023-06-18 NOTE — ED Notes (Signed)
 Recheck of visual acuity  Right 20/30 Left 20/30 Both 20/40

## 2023-06-18 NOTE — Discharge Instructions (Addendum)
 You were evaluated in the ED for neck pain and back pain.  Your workup is overall reassuring.  Your MRI reveals no acute injury or abnormality.  Please follow-up with neurosurgery.  Call and schedule appointment with Dr. Mont Antis tomorrow for further evaluation.  In the interim alternate taking Tylenol  and ibuprofen  for your discomfort and pain.  Get plenty of rest and limit physical activity until follow-up appointment.

## 2023-06-18 NOTE — ED Notes (Signed)
 See triage note  Presents with neck and upper back pain  Denies any specific injury  States he is also having some issues with his eyes    Redness to left eye but feels discomfort to both eyes Afebrile on arrival

## 2023-06-18 NOTE — ED Notes (Signed)
 States vision in right eye and left eye are blurry Vision with both eyes is worse  unable to read chart

## 2023-06-18 NOTE — ED Notes (Signed)
 Pt states he normally drinks 2 40s a day for awhile now. Pt states last drink was two days ago. Pt does have tremors. Pt states he doesn't feel like he is in withdrawal and has been in it before.

## 2023-06-18 NOTE — ED Triage Notes (Signed)
 Pt comes via EMS from [facility] with c/o neck pain and upper back pain. Pt states week ago he had pop and felt it I neck. Pt has been having tremors for week.  Pt does have trauma to left eye but not sure where it is from pt denies any recent injuries or trauma. Pt  states it has been there for three days. 151/95 HR 90 97% RA CBG 889

## 2024-01-09 ENCOUNTER — Encounter: Payer: Self-pay | Admitting: *Deleted

## 2024-01-09 ENCOUNTER — Other Ambulatory Visit: Payer: Self-pay

## 2024-01-09 DIAGNOSIS — S3992XA Unspecified injury of lower back, initial encounter: Secondary | ICD-10-CM | POA: Insufficient documentation

## 2024-01-09 DIAGNOSIS — W000XXA Fall on same level due to ice and snow, initial encounter: Secondary | ICD-10-CM | POA: Insufficient documentation

## 2024-01-09 NOTE — ED Triage Notes (Signed)
 Pt ambulatory to triage.  Pt reports he fell down a ramp today and fell again 1 week ago.  Pt has lower back pain and right leg pain. Pt alert

## 2024-01-10 ENCOUNTER — Emergency Department
Admission: EM | Admit: 2024-01-10 | Discharge: 2024-01-10 | Disposition: A | Payer: Self-pay | Attending: Emergency Medicine | Admitting: Emergency Medicine

## 2024-01-10 DIAGNOSIS — W19XXXA Unspecified fall, initial encounter: Secondary | ICD-10-CM

## 2024-01-10 DIAGNOSIS — S3992XA Unspecified injury of lower back, initial encounter: Secondary | ICD-10-CM

## 2024-01-10 MED ORDER — OXYCODONE HCL 5 MG PO TABS
5.0000 mg | ORAL_TABLET | Freq: Once | ORAL | Status: AC
  Administered 2024-01-10: 5 mg via ORAL
  Filled 2024-01-10: qty 1

## 2024-01-10 MED ORDER — LIDOCAINE 5 % EX PTCH
1.0000 | MEDICATED_PATCH | CUTANEOUS | Status: DC
  Administered 2024-01-10: 1 via TRANSDERMAL
  Filled 2024-01-10: qty 1

## 2024-01-10 MED ORDER — ACETAMINOPHEN 500 MG PO TABS
1000.0000 mg | ORAL_TABLET | Freq: Once | ORAL | Status: AC
  Administered 2024-01-10: 1000 mg via ORAL
  Filled 2024-01-10: qty 2

## 2024-01-10 MED ORDER — KETOROLAC TROMETHAMINE 30 MG/ML IJ SOLN
30.0000 mg | Freq: Once | INTRAMUSCULAR | Status: DC
  Filled 2024-01-10: qty 1

## 2024-01-10 MED ORDER — KETOROLAC TROMETHAMINE 60 MG/2ML IM SOLN
30.0000 mg | Freq: Once | INTRAMUSCULAR | Status: AC
  Administered 2024-01-10: 30 mg via INTRAMUSCULAR

## 2024-01-10 MED ORDER — LIDOCAINE 5 % EX PTCH: 1.0000 | MEDICATED_PATCH | CUTANEOUS | 0 refills | Status: AC

## 2024-01-10 MED ORDER — LIDOCAINE 5 % EX PTCH: 1.0000 | MEDICATED_PATCH | CUTANEOUS | 0 refills | Status: DC

## 2024-01-10 NOTE — Discharge Instructions (Signed)
 Take acetaminophen  650 mg and ibuprofen  400 mg every 6 hours for pain.  Take with food. Use Lidoderm  pain patch as directed  Thank you for choosing us  for your health care today!  Please see your primary doctor this week for a follow up appointment.   If you have any new, worsening, or unexpected symptoms call your doctor right away or come back to the emergency department for reevaluation.  It was my pleasure to care for you today.   Ginnie EDISON Cyrena, MD

## 2024-01-10 NOTE — ED Provider Notes (Signed)
 "  [facility] Provider Note    Event Date/Time   First MD Initiated Contact with Patient 01/10/24 0139     (approximate)   History   Fall   HPI  Jimmy Thompson is a 48 y.o. male   Past medical history of seizure disorder, here with a slip and fall on ice onto his right side and injured his right buttock/lower back.  Pain radiates down the leg and up the upper back.  No head strike or loss of consciousness.  Able to get up and ambulate.  He has urinated and seen no blood.  No other acute medical complaints.   External Medical Documents Reviewed: Prior hospital notes      Physical Exam   Triage Vital Signs: ED Triage Vitals  Encounter Vitals Group     BP 01/09/24 2142 (!) 133/96     Girls Systolic BP Percentile --      Girls Diastolic BP Percentile --      Boys Systolic BP Percentile --      Boys Diastolic BP Percentile --      Pulse Rate 01/09/24 2142 (!) 119     Resp 01/09/24 2142 18     Temp 01/09/24 2142 98.6 F (37 C)     Temp Source 01/09/24 2142 Oral     SpO2 01/09/24 2142 96 %     Weight 01/09/24 2139 230 lb (104.3 kg)     Height 01/09/24 2139 6' (1.829 m)     Head Circumference --      Peak Flow --      Pain Score 01/09/24 2139 9     Pain Loc --      Pain Education --      Exclude from Growth Chart --     Most recent vital signs: Vitals:   01/09/24 2142  BP: (!) 133/96  Pulse: (!) 119  Resp: 18  Temp: 98.6 F (37 C)  SpO2: 96%    General: Awake, no distress.  CV:  Good peripheral perfusion.  Resp:  Normal effort.  Abd:  No distention.  Other:  Well-appearing patient tachycardic in triage but normal rate and rhythm when I assessed.  Able to move all extremities at all joints with full active range of motion.  Sensate intact bilateral lower extremities.  No midline T or L-spine tenderness no signs of head trauma.  Neck supple full range of motion.  Mild paraspinal right-sided lumbar tenderness to palpation.   ED Results  / Procedures / Treatments   Labs (all labs ordered are listed, but only abnormal results are displayed) Labs Reviewed - No data to display  PROCEDURES:  Critical Care performed: No  Procedures   MEDICATIONS ORDERED IN ED: Medications  ketorolac  (TORADOL ) 30 MG/ML injection 30 mg (has no administration in time range)  acetaminophen  (TYLENOL ) tablet 1,000 mg (has no administration in time range)  lidocaine  (LIDODERM ) 5 % 1 patch (has no administration in time range)  oxyCODONE  (Oxy IR/ROXICODONE ) immediate release tablet 5 mg (has no administration in time range)    IMPRESSION / MDM / ASSESSMENT AND PLAN / ED COURSE  I reviewed the triage vital signs and the nursing notes.                                Patient's presentation is most consistent with acute presentation with potential threat to life or bodily function.  Differential  diagnosis includes, but is not limited to, mechanical fall leading to lumbar strain, contusion, considered but less likely fractures dislocations cord compression   MDM:    Mechanical slip and fall leading to the buttock/back injury.  Likely contusion/muscle strain.  No red flag symptoms to suggest cord compressive lesions and no bony tenderness suggest fractures or dislocations.  Able to range all extremities at all joints despite injury.  Pain control, anticipatory guidance and discharge home.       FINAL CLINICAL IMPRESSION(S) / ED DIAGNOSES   Final diagnoses:  Fall, initial encounter  Lower back injury, initial encounter     Rx / DC Orders   ED Discharge Orders          Ordered    lidocaine  (LIDODERM ) 5 %  Every 24 hours        01/10/24 0155             Note:  This document was prepared using Dragon voice recognition software and may include unintentional dictation errors.    Cyrena Mylar, MD 01/10/24 0157  "
